# Patient Record
Sex: Female | Born: 1970 | Race: Black or African American | Hispanic: No | Marital: Married | Smoking: Never smoker
Health system: Southern US, Community
[De-identification: ages and names within clinical notes are randomized; demographics above are authoritative.]

## PROBLEM LIST (undated history)

## (undated) DIAGNOSIS — K589 Irritable bowel syndrome without diarrhea: Secondary | ICD-10-CM

## (undated) DIAGNOSIS — B9689 Other specified bacterial agents as the cause of diseases classified elsewhere: Secondary | ICD-10-CM

## (undated) DIAGNOSIS — K219 Gastro-esophageal reflux disease without esophagitis: Secondary | ICD-10-CM

## (undated) DIAGNOSIS — D219 Benign neoplasm of connective and other soft tissue, unspecified: Secondary | ICD-10-CM

## (undated) DIAGNOSIS — I341 Nonrheumatic mitral (valve) prolapse: Secondary | ICD-10-CM

## (undated) DIAGNOSIS — Z9889 Other specified postprocedural states: Secondary | ICD-10-CM

## (undated) DIAGNOSIS — N760 Acute vaginitis: Secondary | ICD-10-CM

## (undated) DIAGNOSIS — H6690 Otitis media, unspecified, unspecified ear: Secondary | ICD-10-CM

## (undated) DIAGNOSIS — D1800 Hemangioma unspecified site: Secondary | ICD-10-CM

## (undated) DIAGNOSIS — Z87440 Personal history of urinary (tract) infections: Secondary | ICD-10-CM

## (undated) DIAGNOSIS — Z8669 Personal history of other diseases of the nervous system and sense organs: Secondary | ICD-10-CM

## (undated) DIAGNOSIS — G510 Bell's palsy: Secondary | ICD-10-CM

## (undated) HISTORY — DX: Gastro-esophageal reflux disease without esophagitis: K21.9

## (undated) HISTORY — DX: Personal history of other diseases of the nervous system and sense organs: Z86.69

## (undated) HISTORY — DX: Other specified bacterial agents as the cause of diseases classified elsewhere: B96.89

## (undated) HISTORY — DX: Personal history of urinary (tract) infections: Z87.440

## (undated) HISTORY — DX: Otitis media, unspecified, unspecified ear: H66.90

## (undated) HISTORY — PX: BUNIONECTOMY: SHX129

## (undated) HISTORY — PX: COLONOSCOPY: SHX174

## (undated) HISTORY — DX: Other specified postprocedural states: Z98.890

## (undated) HISTORY — DX: Other specified bacterial agents as the cause of diseases classified elsewhere: N76.0

## (undated) HISTORY — PX: TOOTH EXTRACTION: SUR596

## (undated) HISTORY — DX: Hemangioma unspecified site: D18.00

## (undated) HISTORY — DX: Irritable bowel syndrome, unspecified: K58.9

## (undated) HISTORY — DX: Benign neoplasm of connective and other soft tissue, unspecified: D21.9

## (undated) HISTORY — DX: Nonrheumatic mitral (valve) prolapse: I34.1

## (undated) HISTORY — DX: Bell's palsy: G51.0

---
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
## 1999-03-07 ENCOUNTER — Inpatient Hospital Stay (HOSPITAL_COMMUNITY): Admission: AD | Admit: 1999-03-07 | Discharge: 1999-03-07 | Payer: Self-pay | Admitting: Obstetrics & Gynecology

## 2000-06-20 ENCOUNTER — Inpatient Hospital Stay (HOSPITAL_COMMUNITY): Admission: AD | Admit: 2000-06-20 | Discharge: 2000-06-20 | Payer: Self-pay | Admitting: *Deleted

## 2003-09-11 ENCOUNTER — Inpatient Hospital Stay (HOSPITAL_COMMUNITY): Admission: AD | Admit: 2003-09-11 | Discharge: 2003-09-11 | Payer: Self-pay | Admitting: Obstetrics and Gynecology

## 2004-04-04 ENCOUNTER — Ambulatory Visit (HOSPITAL_COMMUNITY): Admission: RE | Admit: 2004-04-04 | Discharge: 2004-04-04 | Payer: Self-pay | Admitting: Obstetrics and Gynecology

## 2004-10-03 ENCOUNTER — Ambulatory Visit: Payer: Self-pay | Admitting: Professional

## 2004-10-10 ENCOUNTER — Ambulatory Visit: Payer: Self-pay | Admitting: Professional

## 2004-10-27 DIAGNOSIS — D219 Benign neoplasm of connective and other soft tissue, unspecified: Secondary | ICD-10-CM

## 2004-10-27 HISTORY — DX: Benign neoplasm of connective and other soft tissue, unspecified: D21.9

## 2004-10-31 ENCOUNTER — Ambulatory Visit: Payer: Self-pay | Admitting: Professional

## 2004-11-07 ENCOUNTER — Ambulatory Visit: Payer: Self-pay | Admitting: Professional

## 2004-11-21 ENCOUNTER — Ambulatory Visit: Payer: Self-pay | Admitting: Professional

## 2004-12-25 ENCOUNTER — Other Ambulatory Visit: Admission: RE | Admit: 2004-12-25 | Discharge: 2004-12-25 | Payer: Self-pay | Admitting: Obstetrics and Gynecology

## 2006-02-25 ENCOUNTER — Other Ambulatory Visit: Admission: RE | Admit: 2006-02-25 | Discharge: 2006-02-25 | Payer: Self-pay | Admitting: Obstetrics and Gynecology

## 2006-04-08 ENCOUNTER — Ambulatory Visit (HOSPITAL_COMMUNITY): Admission: RE | Admit: 2006-04-08 | Discharge: 2006-04-08 | Payer: Self-pay | Admitting: Obstetrics and Gynecology

## 2006-12-07 ENCOUNTER — Ambulatory Visit (HOSPITAL_COMMUNITY): Admission: RE | Admit: 2006-12-07 | Discharge: 2006-12-07 | Payer: Self-pay | Admitting: Obstetrics and Gynecology

## 2007-04-23 ENCOUNTER — Other Ambulatory Visit: Admission: RE | Admit: 2007-04-23 | Discharge: 2007-04-23 | Payer: Self-pay | Admitting: Obstetrics and Gynecology

## 2008-03-17 ENCOUNTER — Ambulatory Visit (HOSPITAL_COMMUNITY): Admission: RE | Admit: 2008-03-17 | Discharge: 2008-03-17 | Payer: Self-pay | Admitting: Obstetrics and Gynecology

## 2008-04-23 ENCOUNTER — Inpatient Hospital Stay (HOSPITAL_COMMUNITY): Admission: AD | Admit: 2008-04-23 | Discharge: 2008-04-24 | Payer: Self-pay | Admitting: Obstetrics and Gynecology

## 2008-09-12 ENCOUNTER — Encounter: Admission: RE | Admit: 2008-09-12 | Discharge: 2008-09-12 | Payer: Self-pay | Admitting: Obstetrics and Gynecology

## 2008-10-27 ENCOUNTER — Inpatient Hospital Stay (HOSPITAL_COMMUNITY): Admission: AD | Admit: 2008-10-27 | Discharge: 2008-10-31 | Payer: Self-pay | Admitting: Obstetrics and Gynecology

## 2008-10-27 ENCOUNTER — Inpatient Hospital Stay (HOSPITAL_COMMUNITY): Admission: AD | Admit: 2008-10-27 | Discharge: 2008-10-27 | Payer: Self-pay | Admitting: Obstetrics and Gynecology

## 2008-10-27 ENCOUNTER — Encounter (INDEPENDENT_AMBULATORY_CARE_PROVIDER_SITE_OTHER): Payer: Self-pay | Admitting: Obstetrics and Gynecology

## 2008-11-01 ENCOUNTER — Encounter: Admission: RE | Admit: 2008-11-01 | Discharge: 2008-12-01 | Payer: Self-pay | Admitting: Obstetrics and Gynecology

## 2008-12-02 ENCOUNTER — Encounter: Admission: RE | Admit: 2008-12-02 | Discharge: 2008-12-29 | Payer: Self-pay | Admitting: Obstetrics and Gynecology

## 2008-12-30 ENCOUNTER — Encounter: Admission: RE | Admit: 2008-12-30 | Discharge: 2009-01-29 | Payer: Self-pay | Admitting: Obstetrics and Gynecology

## 2009-01-30 ENCOUNTER — Encounter: Admission: RE | Admit: 2009-01-30 | Discharge: 2009-02-17 | Payer: Self-pay | Admitting: Obstetrics and Gynecology

## 2010-03-04 ENCOUNTER — Ambulatory Visit (HOSPITAL_COMMUNITY): Admission: RE | Admit: 2010-03-04 | Discharge: 2010-03-04 | Payer: Self-pay | Admitting: Obstetrics and Gynecology

## 2010-10-27 DIAGNOSIS — D1800 Hemangioma unspecified site: Secondary | ICD-10-CM

## 2010-10-27 DIAGNOSIS — H6690 Otitis media, unspecified, unspecified ear: Secondary | ICD-10-CM

## 2010-10-27 HISTORY — DX: Otitis media, unspecified, unspecified ear: H66.90

## 2010-10-27 HISTORY — PX: ENDOMETRIAL BIOPSY: SHX622

## 2010-10-27 HISTORY — DX: Hemangioma unspecified site: D18.00

## 2010-11-05 ENCOUNTER — Encounter
Admission: RE | Admit: 2010-11-05 | Discharge: 2010-11-05 | Payer: Self-pay | Source: Home / Self Care | Attending: Obstetrics and Gynecology | Admitting: Obstetrics and Gynecology

## 2010-11-17 ENCOUNTER — Encounter: Payer: Self-pay | Admitting: Obstetrics and Gynecology

## 2011-02-10 LAB — CBC
HCT: 24 % — ABNORMAL LOW (ref 36.0–46.0)
HCT: 29.3 % — ABNORMAL LOW (ref 36.0–46.0)
Hemoglobin: 7.9 g/dL — CL (ref 12.0–15.0)
Hemoglobin: 9.5 g/dL — ABNORMAL LOW (ref 12.0–15.0)
MCHC: 32.4 g/dL (ref 30.0–36.0)
MCHC: 33.1 g/dL (ref 30.0–36.0)
MCV: 76.5 fL — ABNORMAL LOW (ref 78.0–100.0)
MCV: 77.3 fL — ABNORMAL LOW (ref 78.0–100.0)
Platelets: 434 10*3/uL — ABNORMAL HIGH (ref 150–400)
Platelets: 502 10*3/uL — ABNORMAL HIGH (ref 150–400)
RBC: 3.14 MIL/uL — ABNORMAL LOW (ref 3.87–5.11)
RBC: 3.79 MIL/uL — ABNORMAL LOW (ref 3.87–5.11)
RDW: 17.5 % — ABNORMAL HIGH (ref 11.5–15.5)
RDW: 17.7 % — ABNORMAL HIGH (ref 11.5–15.5)
WBC: 10.1 10*3/uL (ref 4.0–10.5)
WBC: 9.2 10*3/uL (ref 4.0–10.5)

## 2011-02-10 LAB — RPR: RPR Ser Ql: NONREACTIVE

## 2011-03-11 NOTE — Op Note (Signed)
Margaret Durham, QUAST NO.:  [id number]   MEDICAL RECORD NO.:  [id number]          PATIENT TYPE:  INP   LOCATION:  9142                          FACILITY:  [facility]   PHYSICIAN:  Osborn Coho, M.D.   DATE OF BIRTH:  Jul 30, 1971   DATE OF PROCEDURE:  10/27/2008  DATE OF DISCHARGE:                               OPERATIVE REPORT   PREOPERATIVE DIAGNOSES:  1. 39 plus weeks.  2. Nonreassuring fetal heart tracing.  3. Chorioamnionitis.  4. Thick meconium   POSTOPERATIVE DIAGNOSES:  1. 39 plus weeks.  2. Nonreassuring fetal heart tracing.  3. Chorioamnionitis.  4. Thick meconium.   PROCEDURE:  Primary low transverse cesarean section.   ANESTHESIA:  Epidural.   ATTENDING:  Osborn Coho, MD   ASSISTANT:  Elby Showers. Williams, CNM   FLUIDS:  3700 mL.   URINE OUTPUT:  375 mL.   ESTIMATED BLOOD LOSS:  800 mL.   COMPLICATIONS:  None.   FINDINGS:  Live female infant with Apgars of 9 at 1 minute and 9 at 5  minutes weighing 7 pounds and 10 ounces.  Normal-appearing bilateral  ovaries and fallopian tubes.  There were 2 pedunculated fibroids noted,  one was on the anterior wall and adherent to the anterior abdominal wall  approaching the bladder and the other was at the fundus.   PROCEDURE IN DETAIL:  The patient was taken to the operating room  emergently secondary to fetal bradycardia and while in the operating  room, the fetal heart beat returned to baseline.  The patient was being  watched closely secondary to fetal decels and chorioamnionitis and thick  meconium.  Once in the operating room, the risks, benefits, and  alternatives were discussed with the patient and the patient verbalized  understanding and consent signed and witnessed and the patient was given  a surgical level, via the epidural.  A Pfannenstiel skin incision was  made and carried down to the underlying layer of fascia with the  scalpel.  The fascia was excised bilaterally in the midline and  extended  bilaterally manually.  Kocher clamps were placed on the inferior aspect  of the fascial incision and the rectus muscle was excised from the  fascia.  The same was done on the superior aspect of the fascial  incision.  The rectus muscle was separated in the midline and the  peritoneum was entered bluntly and extended manually.  The bladder blade  was placed and bladder flap created with the Metzenbaum scissors.  The  uterine incision was made with a scalpel and extended bilaterally with  the bandage scissors.  The cord was noted to be right at the level of  the incision and upon delivery of the infant in the vertex presentation,  the cord was somewhat looped near the anterior shoulder.  The cord was  clamped and cut and the infant handed to the awaiting pediatricians.  The placenta was removed via fundal massage and the uterus was cleared  of all clots and debris.  The uterine incision was repaired with 0  Vicryl, via a running interlocking stitch and a second  imbricating layer  was performed.  The uterine incision was noted to be hemostatic and the  intra-abdominal cavity was copiously irrigated.  The 2 fibroids were  noted and the adhesions to the anterior wall fibroid were taken down  sharply and with the Bovie.  Hemostasis was obtained with the Bovie.  The bladder blade was removed and the peritoneum was repaired with 2-0  chromic, via a running stitch.  The fascia was repaired with 0 Vicryl,  via running stitch.  The subcutaneous tissue was reapproximated using 2-  0 plain, via 3 interrupted stitches.  The skin was reapproximated using  3-0 Monocryl.  Dermabond was placed at the edges of the incision and  dressing applied.  Sponge, lap, and needle count was correct.  The  patient tolerated the procedure well and was returned to the recovery  room in good condition.      Osborn Coho, M.D.  Electronically Signed     AR/MEDQ  D:  10/27/2008  T:  10/27/2008  Job:   [id number]

## 2011-03-11 NOTE — Discharge Summary (Signed)
NAMEMarland Kitchen  Margaret Durham, Margaret Durham NO.:  [id number]   MEDICAL RECORD NO.:  [id number]          PATIENT TYPE:  INP   LOCATION:  9142                          [facility]  [facility]   PHYSICIAN:  Osborn Coho, M.D.   DATE OF BIRTH:  Mar 24, 1971   DATE OF ADMISSION:  10/27/2008  DATE OF DISCHARGE:  10/31/2008                               DISCHARGE SUMMARY   ADMITTING PHYSICIAN:  Osborn Coho, MD   DISCHARGING PHYSICIAN:  Janine Limbo, MD   ADMISSION DIAGNOSES:  1. Intrauterine pregnancy at 97 and 2/7th weeks.  2. Early labor.  3. Meconium-stained amniotic fluid with premature rupture at term.   DISCHARGE DIAGNOSES:  1. Intrauterine pregnancy at 10 and 2/7th weeks.  2. Early labor.  3. Meconium-stained amniotic fluid with premature rupture at term.  4. Variable decelerations in labor.  5. Nonreassuring fetal heart rate tracing.  6. Chorioamnionitis.  7. Anemia, postop.   HOSPITAL PROCEDURES:  1. Electronic fetal monitoring.  2. Epidural anesthesia.  3. Internal fetal monitoring.  4. Primary low-transverse cesarean section.   HOSPITAL COURSE:  The patient was admitted in early labor with leakage  of meconium-stained fluid.  She was taken to Labor and Delivery.  IUPC  was placed for amnioinfusion and Dr. Su Hilt assumed care.  At that  time, she got an epidural for pain.  She progressed throughout the day  in the evening to 7-8 cm at which time she developed variable  decelerations and a nonreassuring fetal heart rate tracing and  chorioamnionitis.  Decision was made to proceed with primary low-  transverse cesarean section, which was performed under epidural  anesthesia for a live female infant, Apgars 9 and 9 weighing 7 pounds 10  ounces.  She was taken to recovery and then to Mother Baby Unit where  she received routine care.  On postop day #1, she was doing well,  hemoglobin was 7.9, but she was hemodynamically stable.  She had no  dizziness or trouble getting out  of bed.  She declined transfusion and  was started on iron supplements.  On postop day #2, she continued to  improve.  She was tolerating regular diet and was getting out of bed  without problems.  On postop day #3, the baby was having to stay for an  extra day for feeding issues, and the patient elected to stay an extra  day.  Her lochia was small.  Incision was clean and dry.  She met with  Social Work due to a history of depression and was felt to be stable for  discharge.  On postop day #4, she was ready to go home.  Vital signs  were stable.  Blood pressure 120/77.  Heart rate, regular rate and  rhythm.  Chest was clear.  Abdomen was soft and appropriately tender.  Incision was clean, dry, and intact.  Lochia was small.  Extremities  normal except for 1+ edema.  She was deemed to have received full  benefit of her hospital stay and was discharged home.   DISCHARGE MEDICATIONS:  Motrin 600 mg p.o. q.6 h. p.r.n., Tylox 1-2 p.o.  q.4 h. P.r.n.   DISCHARGE LABORATORIES:  White blood cell count 9.2, hemoglobin 7.9,  platelets 434.   DISCHARGE INSTRUCTIONS:  Per [facility] handout.   DISCHARGE FOLLOWUP:  In 6 weeks or p.r.n.   CONDITION ON DISCHARGE:  Good.      Margaret Durham, C.N.M.      Osborn Coho, M.D.  Electronically Signed    MLW/MEDQ  D:  10/31/2008  T:  10/31/2008  Job:  [id number]

## 2011-03-11 NOTE — H&P (Signed)
Margaret Durham, INGRAM NO.:  [id number]   MEDICAL RECORD NO.:  [id number]          PATIENT TYPE:  INP   LOCATION:  9168                          [facility]  [facility]   PHYSICIAN:  Osborn Coho, M.D.   DATE OF BIRTH:  01-19-1971   DATE OF ADMISSION:  10/27/2008  DATE OF DISCHARGE:                              HISTORY & PHYSICAL   HISTORY OF PRESENT ILLNESS:  This is a 40 year old gravida 2, para 0-0-1-  0 at 69 and 2/7 weeks' who presents unannounced with contractions while  sitting in triage.  She started leaking green and yellow fluid.  She  reports positive fetal movement.  Pregnancy has been followed by Dr.  Pennie Rushing, remarkable for.  1. Fibroids.  2. AMA.  3. IBS.  4. Mitral valve prolapse.  5. GERD.  6. Increased BMI.  7. First trimester spotting.  8. Group B strep negative.   ALLERGIES:  MACROBID causes a rash.   OB HISTORY:  Remarkable for an elective abortion in 1986.   MEDICAL HISTORY:  Remarkable for fibroids diagnosed in 2006, childhood  varicella, mitral valve prolapse, and irritable bowel syndrome.   SURGICAL HISTORY:  Remarkable for elective abortion in 1986.   FAMILY HISTORY:  Remarkable for grandmother and aunt with mitral valve  prolapse.  Grandmother with high blood pressure.  Mother with varicose  veins.  Grandmother with diabetes.  Two grandparents with stroke.   GENETIC HISTORY:  Remarkable for the patient's age of 18 and father of  baby's cousin with webbed toes.   SOCIAL HISTORY:  The patient is married to Sharrie Self, who is  involved and supportive.  She does not report religious affiliation.  She denies any alcohol, tobacco, or drug use.   PRENATAL LABS:  Hemoglobin 12.7, platelets 400, blood type O positive,  antibody screen negative, sickle cell negative, RPR nonreactive, rubella  immune, hepatitis negative, HIV negative, Pap test normal, gonorrhea  negative, and Chlamydia negative.   History of current pregnancy, the  patient entered care at 10 weeks'  gestation.  She was counseled on amnio and declined.  She was treated  for nausea, vomiting, and migraines in the first trimester.  She had a  quad screen and an 18-week ultrasound, which gave her a adjusted risk of  1:216.  She had a normal Glucola at 20 weeks' and an elevated one at  that 30 weeks.  Her 3-hour GTT had one out of four values elevated.  She  had an ultrasound of 35 weeks' showing 71 percentile growth and normal  AFI.  Her group B strep was negative at term.  She did express desire  for a tubal ligation.   OBJECTIVE:  VITAL SIGNS:  Stable and afebrile.  HEENT: Within normal limits.  NECK:  Thyroid normal, not enlarged.  CHEST:  Clear to auscultation.  HEART:  Regular rate and rhythm.  ABDOMEN:  Gravid, vertex Sarles exam shows reactive fetal heart rate  with contractions every 3-4 minutes.  There is thick yellow to green  discharge leaking from the vagina.  Cervix is difficult to reach  secondary to fetal position  and patient discomfort.  Cervix is about 1  cm, 75-80% effaced, soft and posterior and -3 with a vertex  presentation, vertex presentation was confirmed this morning by  ultrasound at the bedside.   ASSESSMENT:  1. Intrauterine pregnancy at 30 and 2/7 week.  2. Early labor.  3. Meconium-stained amniotic fluid.   PLAN:  1. Admit to birthing suites per Dr. Su Hilt.  2. Routine MD orders.  3. The patient wants epidural.      Elby Showers. Williams, C.N.M.      Osborn Coho, M.D.  Electronically Signed    MLW/MEDQ  D:  10/27/2008  T:  10/27/2008  Job:  [id number]

## 2011-04-30 ENCOUNTER — Emergency Department: Payer: Self-pay | Admitting: Internal Medicine

## 2011-07-24 LAB — URINALYSIS, ROUTINE W REFLEX MICROSCOPIC
Bilirubin Urine: NEGATIVE
Glucose, UA: NEGATIVE
Hgb urine dipstick: NEGATIVE
Ketones, ur: 15 — AB
Nitrite: NEGATIVE
Protein, ur: NEGATIVE
Specific Gravity, Urine: 1.015
Urobilinogen, UA: 0.2
pH: 6

## 2012-04-20 ENCOUNTER — Telehealth: Payer: Self-pay | Admitting: Obstetrics and Gynecology

## 2012-04-20 NOTE — Telephone Encounter (Signed)
Triage/cht received 

## 2012-04-20 NOTE — Telephone Encounter (Signed)
Pt states she has tenderness in her abdomen and pain after intercourse. Pt states she has an annual appointment on 05/14/12. Advised pt that she could be scheduled for an earlier appointment but she decided she would like to wait until her annual to discuss these issues. Advised pt to call back if any other problems or if she would like to make an appointment sooner. Pt voiced understanding.

## 2012-05-14 ENCOUNTER — Encounter: Payer: Self-pay | Admitting: Obstetrics and Gynecology

## 2012-05-14 ENCOUNTER — Ambulatory Visit (INDEPENDENT_AMBULATORY_CARE_PROVIDER_SITE_OTHER): Payer: BC Managed Care – PPO | Admitting: Obstetrics and Gynecology

## 2012-05-14 VITALS — BP 100/58 | HR 96 | Ht 65.0 in | Wt 186.0 lb

## 2012-05-14 DIAGNOSIS — D259 Leiomyoma of uterus, unspecified: Secondary | ICD-10-CM

## 2012-05-14 DIAGNOSIS — Z124 Encounter for screening for malignant neoplasm of cervix: Secondary | ICD-10-CM

## 2012-05-14 DIAGNOSIS — D219 Benign neoplasm of connective and other soft tissue, unspecified: Secondary | ICD-10-CM | POA: Insufficient documentation

## 2012-05-14 DIAGNOSIS — D1800 Hemangioma unspecified site: Secondary | ICD-10-CM

## 2012-05-14 NOTE — Progress Notes (Signed)
Last Pap: 04/29/11 WNL: Yes Regular Periods:yes Contraception: none  Monthly Breast exam:yes, sometimes Tetanus<54yrs:no Nl.Bladder Function:yes Daily BMs:yes Healthy Diet:yes Calcium:yes Mammogram:yes Date of Mammogram: pt states she had one more than one year ago Exercise:no Have often Exercise:  Seatbelt: yes Abuse at home: no Stressful work:no Sigmoid-colonoscopy: n/a Bone Density: No PCP: Dr. Dorothyann Peng Change in PMH: none Change in WUJ:WJXB BP 100/58  Pulse 96  Ht 5\' 5"  (1.651 m)  Wt 186 lb (84.369 kg)  BMI 30.95 kg/m2  LMP 04/21/2012 Pt without complaints Physical Examination: General appearance - alert, well appearing, and in no distress Mental status - normal mood, behavior, speech, dress, motor activity, and thought processes Neck - supple, no significant adenopathy, thyroid exam: thyroid is normal in size without nodules or tenderness Chest - clear to auscultation, no wheezes, rales or rhonchi, symmetric air entry Heart - normal rate and regular rhythm Abdomen - soft, nontender, nondistended, no masses or organomegaly Breasts - breasts appear normal, no suspicious masses, no skin or nipple changes or axillary nodes Pelvic - normal external genitalia, vulva, vagina, cervix, uterus irregular mobile NT and adnexa Rectal - , rectal exam not indicated Back exam - full range of motion, no tenderness, palpable spasm or pain on motion Neurological - alert, oriented, normal speech, no focal findings or movement disorder noted Musculoskeletal - no joint tenderness, deformity or swelling Extremities - no edema, redness or tenderness in the calves or thighs Skin - normal coloration and turgor, no rashes, no suspicious skin lesions noted Routine exam Liver hemangiomas will repeat CT to f/u on growth.  Pt has to have non hormonal BC.  Pt now using condoms  Pap sent yes next due in 3 years Mammogram due yes ordered  RT 1 yr

## 2012-05-14 NOTE — Patient Instructions (Signed)
Intrauterine Device Information An intrauterine device (IUD) is inserted into your uterus and prevents pregnancy. There are 2 types of IUDs available:  Copper IUD. This type of IUD is wrapped in copper wire and is placed inside the uterus. Copper makes the uterus and fallopian tubes produce a fluid that kills sperm. The copper IUD can stay in place for 10 years.   Hormone IUD. This type of IUD contains the hormone progestin (synthetic progesterone). The hormone thickens the cervical mucus and prevents sperm from entering the uterus, and it also thins the uterine lining to prevent implantation of a fertilized egg. The hormone can weaken or kill the sperm that get into the uterus. The hormone IUD can stay in place for 5 years.  Your caregiver will make sure you are a good candidate for a contraceptive IUD. Discuss with your caregiver the possible side effects. ADVANTAGES  It is highly effective, reversible, long-acting, and low maintenance.   There are no estrogen-related side effects.   An IUD can be used when breastfeeding.   It is not associated with weight gain.   It works immediately after insertion.   The copper IUD does not interfere with your female hormones.   The progesterone IUD can make heavy menstrual periods lighter.   The progesterone IUD can be used for 5 years.   The copper IUD can be used for 10 years.  DISADVANTAGES  The progesterone IUD can be associated with irregular bleeding patterns.   The copper IUD can make your menstrual flow heavier and more painful.   You may experience cramping and vaginal bleeding after insertion.  Document Released: 09/16/2004 Document Revised: 10/02/2011 Document Reviewed: 02/15/2011 [facility] Patient Information 2012 [place], [place]. Diaphragm A diaphragmis a soft, latex, dome-shaped barrier that is placed in the vagina with spermicidal jelly before sexual intercourse. It covers the cervix, kills sperm, and blocks the passage of  sperm into the cervix. This method does not protect against sexually transmitted diseases (STDs). A diaphragm must be fitted by a caregiver during a pelvic exam. A caregiver will measure your vagina prior to prescribing a diaphragm. You will also learn about use, care, and problems of a diaphragm during the exam. If you have significant weight changes or become pregnant, it is important to have the diaphragm rechecked and possibly refitted. The diaphragm should be replaced every 2 years, or sooner if damaged. ADVANTAGES  You can use it while breastfeeding.   It is not felt by your sex partner.   It does not interfere with your female hormones.   It works immediately and is not permanent.  DISADVANTAGES  It is sometimes difficult to insert.   It may shift out of place during sexual intercourse.   You must have it fitted and refitted by your caregiver.  HOW TO INSERT A DIAPHRAGM 1. Check the diaphragm for holes by holding it up to the light, stretching the latex, or by filling it with water.  2. Place the spermicide cream or jelly inside the dome and around the rim of the diaphragm.  3. Squeeze the rim of the diaphragm and insert the diaphragm into the vagina.  4. The opening of the dome should face the cervix while inserting the diaphragm.  5. The front part (or top) of the rim should be behind the pubic bone and pushed over the top of the cervix.  6. Be sure the cervix is completely covered by reaching into your vagina and feeling the cervix behind the latex dome  of the diaphragm. If you are uncomfortable, it is not inserted properly. Try inserting it again.  7. Leave the diaphragm in for 6 to 8 hours after intercourse. If intercourse occurs again within these 6 hours, another application of spermicide is needed.  8. The diaphragm should not be left in place for longer than 24 hours.  HOME CARE INSTRUCTIONS   Wash the diaphragm with mild soap and warm water. Rinse thoroughly and dry  completely after every use.   Only use water-based lubricants with the diaphragm. Oil-based lubricants can damage the diaphragm.   Do not use talc on the diaphragm.   Do not use the diaphragm if:   You had a baby in the last 2 months.   You have a vaginal infection.   You are having a menstrual period.   You had recent surgery on your cervix or vagina.   You have vaginal bleeding of unknown cause.   Your sex partner is allergic to latex or spermicides.  SEEK MEDICAL CARE IF:   You have pain during sexual intercourse when using the diaphragm.   The diaphragm slips out of place during sexual intercourse.   You have a fever.   You have blood in your urine.   You have burning or pain when you urinate.   You find a hole in the diaphragm.   You develop abnormal vaginal discharge.   You have vaginal itching or irritation.   You cannot remove the diaphragm.   You think you may be pregnant.   You need to be refitted for a diaphragm.  Document Released: 01/03/2003 Document Revised: 10/02/2011 Document Reviewed: 02/26/2011 [facility] Patient Information 2012 [place], [place].

## 2012-05-18 LAB — PAP IG W/ RFLX HPV ASCU

## 2012-05-19 ENCOUNTER — Telehealth: Payer: Self-pay

## 2012-05-19 ENCOUNTER — Other Ambulatory Visit: Payer: Self-pay

## 2012-05-19 ENCOUNTER — Telehealth: Payer: Self-pay | Admitting: Obstetrics and Gynecology

## 2012-05-19 DIAGNOSIS — D1800 Hemangioma unspecified site: Secondary | ICD-10-CM

## 2012-05-19 NOTE — Telephone Encounter (Signed)
Spoke with pt rgd refer informed correct order put in for ct scan advised pt to call [facility] to sch appt pt voice understanding

## 2012-05-20 ENCOUNTER — Other Ambulatory Visit (HOSPITAL_COMMUNITY): Payer: Self-pay

## 2012-05-25 ENCOUNTER — Ambulatory Visit (HOSPITAL_COMMUNITY)
Admission: RE | Admit: 2012-05-25 | Discharge: 2012-05-25 | Disposition: A | Discharge status: Home / Self Care | Payer: BC Managed Care – PPO | Source: Ambulatory Visit | Attending: Obstetrics and Gynecology | Admitting: Obstetrics and Gynecology

## 2012-05-25 DIAGNOSIS — K7689 Other specified diseases of liver: Secondary | ICD-10-CM | POA: Insufficient documentation

## 2012-05-25 DIAGNOSIS — D259 Leiomyoma of uterus, unspecified: Secondary | ICD-10-CM | POA: Insufficient documentation

## 2012-05-25 DIAGNOSIS — D1800 Hemangioma unspecified site: Secondary | ICD-10-CM

## 2012-05-25 MED ORDER — IOHEXOL 300 MG/ML  SOLN
100.0000 mL | Freq: Once | INTRAMUSCULAR | Status: AC | PRN
  Administered 2012-05-25: 100 mL via INTRAVENOUS

## 2012-05-27 ENCOUNTER — Telehealth: Payer: Self-pay

## 2012-05-27 NOTE — Telephone Encounter (Signed)
Spoke with pt rgd ct scan informed no change since last ct will refer to Dr.Mann pt has appt 06/22/12 at 1:30 pt voice understanding

## 2012-05-27 NOTE — Telephone Encounter (Signed)
Message copied by Rolla Plate on Thu May 27, 2012 10:58 AM ------      Message from: Jaymes Graff      Created: Thu May 27, 2012  9:55 AM       Please tell the patient that her CT scan is unchanged from the previous one.  Her hemangiomas and fibroids have not changed in size.  Please refer her to Dr Loreta Ave for further evaluation and recommendations for this condition.  Thank you

## 2012-06-04 ENCOUNTER — Ambulatory Visit: Payer: Self-pay

## 2012-06-08 ENCOUNTER — Ambulatory Visit: Payer: Self-pay

## 2012-06-14 ENCOUNTER — Ambulatory Visit: Payer: Self-pay

## 2012-06-22 ENCOUNTER — Other Ambulatory Visit: Payer: Self-pay | Admitting: Gastroenterology

## 2012-06-22 DIAGNOSIS — R1011 Right upper quadrant pain: Secondary | ICD-10-CM

## 2012-06-22 DIAGNOSIS — R110 Nausea: Secondary | ICD-10-CM

## 2012-06-22 DIAGNOSIS — R1013 Epigastric pain: Secondary | ICD-10-CM

## 2012-09-02 ENCOUNTER — Ambulatory Visit (HOSPITAL_COMMUNITY): Payer: BC Managed Care – PPO

## 2012-09-02 ENCOUNTER — Encounter (HOSPITAL_COMMUNITY): Payer: BC Managed Care – PPO

## 2013-10-24 ENCOUNTER — Other Ambulatory Visit: Payer: Self-pay | Admitting: Obstetrics and Gynecology

## 2013-10-24 DIAGNOSIS — Z1231 Encounter for screening mammogram for malignant neoplasm of breast: Secondary | ICD-10-CM

## 2013-11-18 ENCOUNTER — Ambulatory Visit (HOSPITAL_COMMUNITY)
Admission: RE | Admit: 2013-11-18 | Discharge: 2013-11-18 | Disposition: A | Discharge status: Home / Self Care | Payer: BC Managed Care – PPO | Source: Ambulatory Visit | Attending: Obstetrics and Gynecology | Admitting: Obstetrics and Gynecology

## 2013-11-18 ENCOUNTER — Ambulatory Visit (HOSPITAL_COMMUNITY): Payer: BC Managed Care – PPO

## 2013-11-18 DIAGNOSIS — Z1231 Encounter for screening mammogram for malignant neoplasm of breast: Secondary | ICD-10-CM

## 2013-11-22 ENCOUNTER — Other Ambulatory Visit: Payer: Self-pay | Admitting: Obstetrics and Gynecology

## 2013-11-22 DIAGNOSIS — R928 Other abnormal and inconclusive findings on diagnostic imaging of breast: Secondary | ICD-10-CM

## 2013-11-24 ENCOUNTER — Other Ambulatory Visit: Payer: Self-pay

## 2013-11-24 ENCOUNTER — Other Ambulatory Visit: Payer: Self-pay | Admitting: Obstetrics and Gynecology

## 2013-11-24 DIAGNOSIS — R928 Other abnormal and inconclusive findings on diagnostic imaging of breast: Secondary | ICD-10-CM

## 2013-12-01 ENCOUNTER — Ambulatory Visit
Admission: RE | Admit: 2013-12-01 | Discharge: 2013-12-01 | Disposition: A | Discharge status: Home / Self Care | Payer: BC Managed Care – PPO | Source: Ambulatory Visit | Attending: Obstetrics and Gynecology | Admitting: Obstetrics and Gynecology

## 2013-12-01 DIAGNOSIS — R928 Other abnormal and inconclusive findings on diagnostic imaging of breast: Secondary | ICD-10-CM

## 2014-02-07 LAB — HM DIABETES EYE EXAM

## 2014-08-28 ENCOUNTER — Encounter: Payer: Self-pay | Admitting: Obstetrics and Gynecology

## 2014-11-28 ENCOUNTER — Ambulatory Visit: Payer: BC Managed Care – PPO | Admitting: Family Medicine

## 2014-11-30 ENCOUNTER — Other Ambulatory Visit (HOSPITAL_COMMUNITY): Payer: Self-pay | Admitting: Obstetrics and Gynecology

## 2014-11-30 DIAGNOSIS — Z1231 Encounter for screening mammogram for malignant neoplasm of breast: Secondary | ICD-10-CM

## 2014-12-07 ENCOUNTER — Ambulatory Visit (HOSPITAL_COMMUNITY)
Admission: RE | Admit: 2014-12-07 | Discharge: 2014-12-07 | Disposition: A | Discharge status: Home / Self Care | Payer: BC Managed Care – PPO | Source: Ambulatory Visit | Attending: Obstetrics and Gynecology | Admitting: Obstetrics and Gynecology

## 2014-12-07 DIAGNOSIS — Z1231 Encounter for screening mammogram for malignant neoplasm of breast: Secondary | ICD-10-CM | POA: Insufficient documentation

## 2015-04-18 ENCOUNTER — Other Ambulatory Visit: Payer: Self-pay | Admitting: Family Medicine

## 2015-04-18 ENCOUNTER — Ambulatory Visit (INDEPENDENT_AMBULATORY_CARE_PROVIDER_SITE_OTHER): Payer: BC Managed Care – PPO | Admitting: Family Medicine

## 2015-04-18 ENCOUNTER — Encounter: Payer: Self-pay | Admitting: Family Medicine

## 2015-04-18 ENCOUNTER — Encounter (INDEPENDENT_AMBULATORY_CARE_PROVIDER_SITE_OTHER): Payer: Self-pay

## 2015-04-18 VITALS — BP 108/62 | HR 90 | Temp 98.2°F | Ht 65.0 in | Wt 174.5 lb

## 2015-04-18 DIAGNOSIS — Z0000 Encounter for general adult medical examination without abnormal findings: Secondary | ICD-10-CM | POA: Diagnosis not present

## 2015-04-18 DIAGNOSIS — Z01419 Encounter for gynecological examination (general) (routine) without abnormal findings: Secondary | ICD-10-CM

## 2015-04-18 DIAGNOSIS — R7989 Other specified abnormal findings of blood chemistry: Secondary | ICD-10-CM

## 2015-04-18 LAB — LIPID PANEL
Cholesterol: 173 mg/dL (ref 0–200)
HDL: 59.2 mg/dL (ref >39.00)
LDL CALC: 104 mg/dL — AB (ref 0–99)
NonHDL: 113.8
Total CHOL/HDL Ratio: 3
Triglycerides: 51 mg/dL (ref 0.0–149.0)
VLDL: 10.2 mg/dL (ref 0.0–40.0)

## 2015-04-18 LAB — CBC WITH DIFFERENTIAL/PLATELET
BASOS PCT: 0.7 % (ref 0.0–3.0)
Basophils Absolute: 0 10*3/uL (ref 0.0–0.1)
EOS PCT: 0.9 % (ref 0.0–5.0)
Eosinophils Absolute: 0 10*3/uL (ref 0.0–0.7)
HCT: 36.9 % (ref 36.0–46.0)
HEMOGLOBIN: 12.2 g/dL (ref 12.0–15.0)
LYMPHS PCT: 34 % (ref 12.0–46.0)
Lymphs Abs: 1.5 10*3/uL (ref 0.7–4.0)
MCHC: 33 g/dL (ref 30.0–36.0)
MCV: 90.6 fl (ref 78.0–100.0)
MONOS PCT: 8.7 % (ref 3.0–12.0)
Monocytes Absolute: 0.4 10*3/uL (ref 0.1–1.0)
NEUTROS ABS: 2.5 10*3/uL (ref 1.4–7.7)
NEUTROS PCT: 55.7 % (ref 43.0–77.0)
Platelets: 353 10*3/uL (ref 150.0–400.0)
RBC: 4.07 Mil/uL (ref 3.87–5.11)
RDW: 13.7 % (ref 11.5–15.5)
WBC: 4.5 10*3/uL (ref 4.0–10.5)

## 2015-04-18 LAB — COMPREHENSIVE METABOLIC PANEL
ALT: 8 U/L (ref 0–35)
AST: 14 U/L (ref 0–37)
Albumin: 3.9 g/dL (ref 3.5–5.2)
Alkaline Phosphatase: 63 U/L (ref 39–117)
BUN: 13 mg/dL (ref 6–23)
CALCIUM: 9.1 mg/dL (ref 8.4–10.5)
CHLORIDE: 106 meq/L (ref 96–112)
CO2: 29 mEq/L (ref 19–32)
CREATININE: 0.91 mg/dL (ref 0.40–1.20)
GFR: 85.64 mL/min (ref >60.00)
Glucose, Bld: 90 mg/dL (ref 70–99)
POTASSIUM: 4.3 meq/L (ref 3.5–5.1)
SODIUM: 139 meq/L (ref 135–145)
Total Bilirubin: 0.4 mg/dL (ref 0.2–1.2)
Total Protein: 6.7 g/dL (ref 6.0–8.3)

## 2015-04-18 LAB — TSH: TSH: 0.31 u[IU]/mL — AB (ref 0.35–4.50)

## 2015-04-18 NOTE — Progress Notes (Signed)
Pre visit review using our clinic review tool, if applicable. No additional management support is needed unless otherwise documented below in the visit note. 

## 2015-04-18 NOTE — Assessment & Plan Note (Signed)
Reviewed preventive care protocols, scheduled due services, and updated immunizations Discussed nutrition, exercise, diet, and healthy lifestyle.  Orders Placed This Encounter  Procedures  . CBC with Differential/Platelet  . Comprehensive metabolic panel  . Lipid panel  . TSH     

## 2015-04-18 NOTE — Progress Notes (Signed)
Subjective:   Patient ID: Margaret Durham, female    DOB: [birth year], 44 y.o.   MRN: [id number]  Margaret Durham is a pleasant 44 y.o. year old female who presents to clinic today with Establish Care  on 04/18/2015  HPI:  Healthy, does not take any medications regularly.  G1P1- has OBGYN- Dr. Charlesetta Garibaldi.  Son is 72 yo.  She is a professor at [employer], physically active.  Runs and does Zumba.  Has no complaints today.  Mammogram and pap smear UTD.  No current outpatient prescriptions on file prior to visit.   No current facility-administered medications on file prior to visit.    Allergies  Allergen Reactions  . Macrobid [Nitrofurantoin Macrocrystal]     Past Medical History  Diagnosis Date  . IBS (irritable bowel syndrome)   . MVP (mitral valve prolapse)   . Fibroids 2006  . History of D&C 1986  . Hemangioma 2012    found on CT scan (liver)  . Otitis 2012  . Bacterial vaginosis     recurrent   . Hx of bladder infections   . Hx of migraine headaches   . Bell's palsy     Past Surgical History  Procedure Laterality Date  . Endometrial biopsy  2012  . Cesarean section  2010  . Tooth extraction      Family History  Problem Relation Age of Onset  . Heart disease Maternal Aunt   . Cancer Maternal Aunt     breast  . Heart disease Maternal Grandmother   . Hypertension Maternal Grandmother   . Diabetes Maternal Grandmother   . Stroke Maternal Grandmother   . Stroke Maternal Grandfather   . Cancer Maternal Grandfather     lung     History   Social History  . Marital Status: Married    Spouse Name: N/A  . Number of Children: N/A  . Years of Education: N/A   Occupational History  . Not on file.   Social History Main Topics  . Smoking status: Never Smoker   . Smokeless tobacco: Never Used  . Alcohol Use: Yes  . Drug Use: No  . Sexual Activity: Yes    Birth Control/ Protection: None   Other Topics Concern  . Not on file   Social History  Narrative   The PMH, PSH, Social History, Family History, Medications, and allergies have been reviewed in [facility], and have been updated if relevant.       Review of Systems  Constitutional: Negative.   HENT: Negative.   Eyes: Negative.   Respiratory: Negative.   Cardiovascular: Negative.   Gastrointestinal: Negative.   Endocrine: Negative.   Genitourinary: Negative.   Musculoskeletal: Negative.   Skin: Negative.   Allergic/Immunologic: Negative.   Neurological: Negative.   Hematological: Negative.   Psychiatric/Behavioral: Negative.   All other systems reviewed and are negative.      Objective:    BP 108/62 mmHg  Pulse 90  Temp(Src) 98.2 F (36.8 C) (Oral)  Ht 5\' 5"  (1.651 m)  Wt 174 lb 8 oz (79.153 kg)  BMI 29.04 kg/m2  SpO2 98%  LMP 03/29/2015   Physical Exam  Constitutional: She is oriented to person, place, and time. She appears well-developed and well-nourished. No distress.  HENT:  Head: Normocephalic.  Eyes: Conjunctivae are normal.  Neck: Normal range of motion. Neck supple. No thyromegaly present.  Cardiovascular: Normal rate.   Murmur heard. Pulmonary/Chest: Effort normal and breath sounds normal.  Abdominal:  Soft. Bowel sounds are normal.  Musculoskeletal: Normal range of motion. She exhibits no edema.  Lymphadenopathy:    She has no cervical adenopathy.  Neurological: She is alert and oriented to person, place, and time. No cranial nerve deficit.  Skin: Skin is warm and dry.  Psychiatric: She has a normal mood and affect. Her behavior is normal. Judgment and thought content normal.          Assessment & Plan:   Well woman exam - Plan: CBC with Differential/Platelet, Comprehensive metabolic panel, Lipid panel, TSH No Follow-up on file.

## 2015-04-18 NOTE — Patient Instructions (Signed)
It was nice to meet you. We will call you with your lab results and you can view them online.

## 2015-05-14 ENCOUNTER — Ambulatory Visit: Payer: BC Managed Care – PPO | Admitting: Family Medicine

## 2015-08-17 LAB — HM DIABETES EYE EXAM

## 2015-08-20 ENCOUNTER — Encounter: Payer: Self-pay | Admitting: Family Medicine

## 2015-09-10 ENCOUNTER — Encounter: Payer: Self-pay | Admitting: Family Medicine

## 2015-09-10 ENCOUNTER — Ambulatory Visit (INDEPENDENT_AMBULATORY_CARE_PROVIDER_SITE_OTHER): Payer: BC Managed Care – PPO | Admitting: Family Medicine

## 2015-09-10 VITALS — BP 98/64 | HR 104 | Temp 98.1°F | Wt 181.2 lb

## 2015-09-10 DIAGNOSIS — Z23 Encounter for immunization: Secondary | ICD-10-CM

## 2015-09-10 DIAGNOSIS — R350 Frequency of micturition: Secondary | ICD-10-CM | POA: Diagnosis not present

## 2015-09-10 LAB — POCT URINALYSIS DIPSTICK
Bilirubin, UA: NEGATIVE
Blood, UA: NEGATIVE
GLUCOSE UA: NEGATIVE
Ketones, UA: NEGATIVE
LEUKOCYTES UA: NEGATIVE
Nitrite, UA: NEGATIVE
Protein, UA: NEGATIVE
Spec Grav, UA: 1.015
UROBILINOGEN UA: 0.2
pH, UA: 6

## 2015-09-10 NOTE — Progress Notes (Signed)
Pre visit review using our clinic review tool, if applicable. No additional management support is needed unless otherwise documented below in the visit note. 

## 2015-09-10 NOTE — Assessment & Plan Note (Signed)
New- persistent for months. Neg UA. OAB vs IC. Given handout on dietary changes she can make to improve symptoms of IC. Refer to urology for cystoscopy given duration of symptoms. The patient indicates understanding of these issues and agrees with the plan.

## 2015-09-10 NOTE — Progress Notes (Signed)
SUBJECTIVE: Margaret Durham is a 44 y.o. female who complains of urinary frequency and frequency with a sense of incomplete bladder emptying for months without flank pain, fever, chills, or abnormal vaginal discharge or bleeding.   Review of Systems  Constitutional: Positive for fatigue. Negative for fever.  Gastrointestinal: Negative.   Genitourinary: Positive for urgency and frequency. Negative for dysuria, hematuria, flank pain, decreased urine volume, vaginal bleeding, vaginal discharge, difficulty urinating, genital sores, vaginal pain, menstrual problem, pelvic pain and dyspareunia.  Neurological: Negative.   Hematological: Negative.   All other systems reviewed and are negative.     No current outpatient prescriptions on file prior to visit.   No current facility-administered medications on file prior to visit.    Allergies  Allergen Reactions  . Macrobid [Nitrofurantoin Macrocrystal]     Past Medical History  Diagnosis Date  . IBS (irritable bowel syndrome)   . MVP (mitral valve prolapse)   . Fibroids 2006  . History of D&C 1986  . Hemangioma 2012    found on CT scan (liver)  . Otitis 2012  . Bacterial vaginosis     recurrent   . Hx of bladder infections   . Hx of migraine headaches   . Bell's palsy   . MVP (mitral valve prolapse)     Past Surgical History  Procedure Laterality Date  . Endometrial biopsy  2012  . Cesarean section  2010  . Tooth extraction      Family History  Problem Relation Age of Onset  . Heart disease Maternal Aunt   . Cancer Maternal Aunt     breast  . Heart disease Maternal Grandmother   . Hypertension Maternal Grandmother   . Diabetes Maternal Grandmother   . Stroke Maternal Grandmother   . Stroke Maternal Grandfather   . Cancer Maternal Grandfather     lung     Social History   Social History  . Marital Status: Married    Spouse Name: N/A  . Number of Children: N/A  . Years of Education: N/A   Occupational  History  . Not on file.   Social History Main Topics  . Smoking status: Never Smoker   . Smokeless tobacco: Never Used  . Alcohol Use: Yes  . Drug Use: No  . Sexual Activity: Yes    Birth Control/ Protection: None   Other Topics Concern  . Not on file   Social History Narrative   The PMH, PSH, Social History, Family History, Medications, and allergies have been reviewed in [facility], and have been updated if relevant.  OBJECTIVE: BP 98/64 mmHg  Pulse 104  Temp(Src) 98.1 F (36.7 C) (Oral)  Wt 181 lb 4 oz (82.214 kg)  SpO2 97%  LMP 08/27/2015 (Approximate)   Appears well, in no apparent distress.  Vital signs are normal. The abdomen is soft without tenderness, guarding, mass, rebound or organomegaly. No CVA tenderness or inguinal adenopathy noted. Urine dipstick shows negative for all components.

## 2015-09-12 ENCOUNTER — Encounter: Payer: Self-pay | Admitting: Obstetrics and Gynecology

## 2015-09-12 ENCOUNTER — Ambulatory Visit (INDEPENDENT_AMBULATORY_CARE_PROVIDER_SITE_OTHER): Payer: BC Managed Care – PPO | Admitting: Obstetrics and Gynecology

## 2015-09-12 VITALS — BP 122/79 | HR 87 | Resp 16 | Ht 65.0 in | Wt 180.3 lb

## 2015-09-12 DIAGNOSIS — R350 Frequency of micturition: Secondary | ICD-10-CM | POA: Diagnosis not present

## 2015-09-12 LAB — URINALYSIS, COMPLETE
Bilirubin, UA: NEGATIVE
GLUCOSE, UA: NEGATIVE
KETONES UA: NEGATIVE
Leukocytes, UA: NEGATIVE
Nitrite, UA: NEGATIVE
PROTEIN UA: NEGATIVE
Specific Gravity, UA: 1.015 (ref 1.005–1.030)
Urobilinogen, Ur: 0.2 mg/dL (ref 0.2–1.0)
pH, UA: 7 (ref 5.0–7.5)

## 2015-09-12 LAB — MICROSCOPIC EXAMINATION: BACTERIA UA: NONE SEEN

## 2015-09-12 LAB — BLADDER SCAN AMB NON-IMAGING: Scan Result: 43

## 2015-09-12 NOTE — Progress Notes (Signed)
09/12/2015 4:32 PM   Seth Bake Charlynn Grimes 1970-12-10 [id number]  Referring provider: Lucille Passy, MD [place], [place] [address]  Chief Complaint  Patient presents with  . Urinary Frequency  . Establish Care    HPI: Patient is a 44 year old African-American female presenting today as a referral from her primary care provider with complaints of urinary frequency occurring intermittently over the last few months.  Nocturia at least 2 times per night.  She notices symptoms typically become worse at the beginning of her menstrual cycle and she occasionally feels the need to void every 22mins.  No associated urgency, incontinence or gross hematuria or dysuria. No pain with bladder filling. She was provided information on interstitial cystitis diet by her primary care provider but states that she has not looked at the information yet.  Denies history heavy or painful periods. States that she only recently starting experiences menstrual cramps.   Never been a smoker. Previous UA unremarkable. No vaginal complaints.  No Long history of GU malignancies.   PMH: Past Medical History  Diagnosis Date  . IBS (irritable bowel syndrome)   . MVP (mitral valve prolapse)   . Fibroids 2006  . History of D&C 1986  . Hemangioma 2012    found on CT scan (liver)  . Otitis 2012  . Bacterial vaginosis     recurrent   . Hx of bladder infections   . Hx of migraine headaches   . Bell's palsy   . MVP (mitral valve prolapse)     Surgical History: Past Surgical History  Procedure Laterality Date  . Endometrial biopsy  2012  . Cesarean section  2010  . Tooth extraction      Home Medications:    Medication List    Notice  As of 09/12/2015  4:32 PM   You have not been prescribed any medications.      Allergies:  Allergies  Allergen Reactions  . Macrobid [Nitrofurantoin Macrocrystal]     Family History: Family History  Problem Relation Age of Onset  . Heart disease  Maternal Aunt   . Cancer Maternal Aunt     breast  . Heart disease Maternal Grandmother   . Hypertension Maternal Grandmother   . Diabetes Maternal Grandmother   . Stroke Maternal Grandmother   . Stroke Maternal Grandfather   . Cancer Maternal Grandfather     lung     Social History:  reports that she has never smoked. She has never used smokeless tobacco. She reports that she drinks alcohol. She reports that she does not use illicit drugs.  ROS: UROLOGY Frequent Urination?: Yes Hard to postpone urination?: No Burning/pain with urination?: No Get up at night to urinate?: Yes Leakage of urine?: No Urine stream starts and stops?: Yes Trouble starting stream?: No Do you have to strain to urinate?: No Blood in urine?: No Urinary tract infection?: No Sexually transmitted disease?: No Injury to kidneys or bladder?: No Painful intercourse?: No Weak stream?: No Currently pregnant?: No Vaginal bleeding?: No Last menstrual period?: 08/27/2015  Gastrointestinal Nausea?: No Vomiting?: No Indigestion/heartburn?: No Diarrhea?: No Constipation?: No  Constitutional Fever: No Night sweats?: No Weight loss?: No Fatigue?: No  Skin Skin rash/lesions?: No Itching?: No  Eyes Blurred vision?: No Double vision?: No  Ears/Nose/Throat Sore throat?: No Sinus problems?: No  Hematologic/Lymphatic Swollen glands?: No Easy bruising?: No  Cardiovascular Leg swelling?: No Chest pain?: No  Respiratory Cough?: No Shortness of breath?: No  Endocrine Excessive thirst?: No  Musculoskeletal Back pain?: No Joint pain?: No  Neurological Headaches?: No Dizziness?: No  Psychologic Depression?: No Anxiety?: No  Physical Exam: BP 122/79 mmHg  Pulse 87  Resp 16  Ht 5\' 5"  (1.651 m)  Wt 180 lb 4.8 oz (81.784 kg)  BMI 30.00 kg/m2  LMP 08/27/2015 (Approximate)  Constitutional:  Alert and oriented, No acute distress. HEENT: [place] AT, moist mucus membranes.  Trachea midline,  no masses. Cardiovascular: No clubbing, cyanosis, or edema. Respiratory: Normal respiratory effort, no increased work of breathing. GI: Abdomen is soft, nontender, nondistended, no abdominal masses GU: No CVA tenderness.  Skin: No rashes, bruises or suspicious lesions. Lymph: No cervical or inguinal adenopathy. Neurologic: Grossly intact, no focal deficits, moving all 4 extremities. Psychiatric: Normal mood and affect.  Laboratory Data:   Urinalysis Results for orders placed or performed in visit on 09/12/15  Microscopic Examination  Result Value Ref Range   WBC, UA 0-5 0 -  5 /hpf   RBC, UA 0-2 0 -  2 /hpf   Epithelial Cells (non renal) 0-10 0 - 10 /hpf   Mucus, UA Present (A) Not Estab.   Bacteria, UA None seen None seen/Few  Urinalysis, Complete  Result Value Ref Range   Specific Gravity, UA 1.015 1.005 - 1.030   pH, UA 7.0 5.0 - 7.5   Color, UA Yellow Yellow   Appearance Ur Clear Clear   Leukocytes, UA Negative Negative   Protein, UA Negative Negative/Trace   Glucose, UA Negative Negative   Ketones, UA Negative Negative   RBC, UA Trace (A) Negative   Bilirubin, UA Negative Negative   Urobilinogen, Ur 0.2 0.2 - 1.0 mg/dL   Nitrite, UA Negative Negative   Microscopic Examination See below:   BLADDER SCAN AMB NON-IMAGING  Result Value Ref Range   Scan Result 43 mL    Pertinent Imaging:   Assessment & Plan:    1. Urinary frequency- PVR 77mL. Urinary frequency and current intermittently over the last 2-3 months. Encouraged patient to complete a bladder diary.  She would like postpone any further workup at this time including cystoscopy as recommended by her PCP. Encouraged increased water intake to dilute urine. She was provided additional information on IC diet reccommendations. - Urinalysis, Complete - BLADDER SCAN AMB NON-IMAGING  Return in about 2 months (around 11/12/2015).  These notes generated with voice recognition software. I apologize for typographical  errors.  Herbert Moors, [facility] [address], [place], [place] [address] [phone]

## 2015-11-13 ENCOUNTER — Ambulatory Visit (INDEPENDENT_AMBULATORY_CARE_PROVIDER_SITE_OTHER): Payer: BC Managed Care – PPO | Admitting: Obstetrics and Gynecology

## 2015-11-13 ENCOUNTER — Encounter: Payer: Self-pay | Admitting: Obstetrics and Gynecology

## 2015-11-13 VITALS — BP 104/71 | HR 64 | Resp 16 | Ht 65.0 in | Wt 182.8 lb

## 2015-11-13 DIAGNOSIS — D259 Leiomyoma of uterus, unspecified: Secondary | ICD-10-CM | POA: Diagnosis not present

## 2015-11-13 DIAGNOSIS — R350 Frequency of micturition: Secondary | ICD-10-CM | POA: Diagnosis not present

## 2015-11-13 LAB — MICROSCOPIC EXAMINATION: WBC, UA: NONE SEEN /hpf (ref 0–?)

## 2015-11-13 LAB — URINALYSIS, COMPLETE
Bilirubin, UA: NEGATIVE
Glucose, UA: NEGATIVE
Ketones, UA: NEGATIVE
LEUKOCYTES UA: NEGATIVE
Nitrite, UA: NEGATIVE
PH UA: 5.5 (ref 5.0–7.5)
PROTEIN UA: NEGATIVE
Specific Gravity, UA: >1.03 — ABNORMAL HIGH (ref 1.005–1.030)
UUROB: 0.2 mg/dL (ref 0.2–1.0)

## 2015-11-13 LAB — BLADDER SCAN AMB NON-IMAGING

## 2015-11-13 NOTE — Progress Notes (Signed)
11:31 AM   Margaret Durham 03-23-1971 [id number]  Referring provider: Lucille Passy, MD [place], [place] [address]  Chief Complaint  Patient presents with  . Urinary Frequency  . Follow-up    2 month    HPI: Patient is a 45 year old African-American female presenting today as a referral from her primary care provider with complaints of urinary frequency occurring intermittently over the last few months.  Nocturia at least 2 times per night.  She notices symptoms typically become worse at the beginning of her menstrual cycle and she occasionally feels the need to void every 22mins.  No associated urgency, incontinence or gross hematuria or dysuria. No pain with bladder filling. She was provided information on interstitial cystitis diet by her primary care provider but states that she has not looked at the information yet. Denies history heavy or painful periods. States that she only recently starting experiences menstrual cramps.  Never been a smoker. Previous UA unremarkable. No vaginal complaints. No [place] history of GU malignancies.   Current Status: Patient presents today for follow-up on urinary frequency. She states that she has noticed that her symptoms seem to increase in severity when she is not eating very well especially when she eats sweets.  PMH: Past Medical History  Diagnosis Date  . IBS (irritable bowel syndrome)   . MVP (mitral valve prolapse)   . Fibroids 2006  . History of D&C 1986  . Hemangioma 2012    found on CT scan (liver)  . Otitis 2012  . Bacterial vaginosis     recurrent   . Hx of bladder infections   . Hx of migraine headaches   . Bell's palsy   . MVP (mitral valve prolapse)     Surgical History: Past Surgical History  Procedure Laterality Date  . Endometrial biopsy  2012  . Cesarean section  2010  . Tooth extraction      Home Medications:    Medication List    Notice  As of 11/13/2015 11:31 AM   You have not been  prescribed any medications.      Allergies:  Allergies  Allergen Reactions  . Macrobid [Nitrofurantoin Macrocrystal]     Family History: Family History  Problem Relation Age of Onset  . Heart disease Maternal Aunt   . Cancer Maternal Aunt     breast  . Heart disease Maternal Grandmother   . Hypertension Maternal Grandmother   . Diabetes Maternal Grandmother   . Stroke Maternal Grandmother   . Stroke Maternal Grandfather   . Cancer Maternal Grandfather     lung     Social History:  reports that she has never smoked. She has never used smokeless tobacco. She reports that she drinks alcohol. She reports that she does not use illicit drugs.  ROS: UROLOGY Frequent Urination?: Yes Hard to postpone urination?: No Burning/pain with urination?: No Get up at night to urinate?: No Leakage of urine?: No Urine stream starts and stops?: No Trouble starting stream?: No Do you have to strain to urinate?: No Blood in urine?: No Urinary tract infection?: No Sexually transmitted disease?: No Injury to kidneys or bladder?: No Painful intercourse?: No Weak stream?: No Currently pregnant?: No Vaginal bleeding?: No Last menstrual period?: n  Gastrointestinal Nausea?: No Vomiting?: No Indigestion/heartburn?: No Diarrhea?: Yes Constipation?: No  Constitutional Fever: No Night sweats?: No Weight loss?: No Fatigue?: No  Skin Skin rash/lesions?: No Itching?: No  Eyes Blurred vision?: No Double vision?: No  Ears/Nose/Throat  Sore throat?: No Sinus problems?: No  Hematologic/Lymphatic Swollen glands?: No Easy bruising?: No  Cardiovascular Leg swelling?: No Chest pain?: No  Respiratory Cough?: No Shortness of breath?: No  Endocrine Excessive thirst?: No  Musculoskeletal Back pain?: No Joint pain?: No  Neurological Headaches?: No Dizziness?: No  Psychologic Depression?: No Anxiety?: No  Physical Exam: BP 104/71 mmHg  Pulse 64  Resp 16  Ht 5\' 5"   (1.651 m)  Wt 182 lb 12.8 oz (82.918 kg)  BMI 30.42 kg/m2  Constitutional:  Alert and oriented, No acute distress. HEENT:  AT, moist mucus membranes.  Trachea midline, no masses. Cardiovascular: No clubbing, cyanosis, or edema. Respiratory: Normal respiratory effort, no increased work of breathing. GI: Abdomen is soft, nontender, nondistended, no abdominal masses Pelvic exam:  Normal urinary meatus, no lesions, normal vaginal mucosa, no cervical motion tenderness, white normal physiological vaginal discharge, palpable right sided pelvic mass most likely uterine fibroid, no tenderness during bimanual exam Skin: No rashes, bruises or suspicious lesions. Neurologic: Grossly intact, no focal deficits, moving all 4 extremities. Psychiatric: Normal mood and affect.  Laboratory Data:   Urinalysis Results for orders placed or performed in visit on 11/13/15  BLADDER SCAN AMB NON-IMAGING  Result Value Ref Range   Scan Result 163 mL    Pertinent Imaging: 05/25/12 Clinical Data: Followup for liver hemangiomas. Evaluate fibroids.  CT ABDOMEN AND PELVIS WITH CONTRAST  Technique: Multidetector CT imaging of the abdomen and pelvis was performed following the standard protocol during bolus administration of intravenous contrast.  Contrast: 120mL OMNIPAQUE IOHEXOL 300 MG/ML SOLN  Comparison: MRI of the abdomen and pelvis 11/05/2010.  Findings:  Lung Bases: Unremarkable.  Abdomen/Pelvis: Again noted are multiple hepatic lesions which are centrally low attenuation with peripheral nodular enhancement on the portal venous phase images that demonstrate progressive central enhancements and the more delayed phases (only one of these lesions was imaged on the delayed phase which was tailored for evaluation of the kidneys). These lesions are similar in size, with the largest lesion in segment 6 of the liver measuring 2.3 x 2.0 cm. No new hepatic lesions are otherwise noted.  The  enhanced appearance of the gallbladder, pancreas, spleen, bilateral adrenal glands and bilateral kidneys is unremarkable. No ascites or pneumoperitoneum and no definite pathologic distension of bowel. No pathologic lymphadenopathy identified within the abdomen or pelvis. Urinary bladder is unremarkable. Multiple phleboliths are noted in the pelvis.  The uterus is again heterogeneous in appearance with multi focal lesions, most compatible with fibroids. The largest of these extends exophytically from the fundus anteriorly and is heavily calcified measuring 4.6 x 4.6 cm.  Musculoskeletal: There are no aggressive appearing lytic or blastic lesions noted in the visualized portions of the skeleton.  IMPRESSION: 1. Multiple liver lesions redemonstrated, as above. Three of these are compatible with hemangiomas (segments 2, 4A and 6) and are unchanged. An additional lesion in the medial aspect of segment 2 is too small to characterize (but is also unchanged and likely to represent a small cyst.). 2. Fibroid uterus redemonstrated, as above.  Pertinent labs & imaging results that were available during my care of the patient were reviewed by me and considered in my medical decision making (see chart for details).  Assessment & Plan:    1. Urinary frequency- bladder scan picking up to 250 ML's. In and out catheterization performed for confrimation with 0 mL output.  I suspect the bladder scan is picking up another fluid collection within the lower abdomen. Could possibly be  interference from large uterine fibroid. I discussed this with the patient and reviewed her previous CT report and images with her as well. It is a possibility that her large fibroids or applying pressure to her bladder and causing her urinary symptoms. We will order a pelvic ultrasound and have her follow up with her regular GYN provider Dr. Charlesetta Garibaldi. Patient will return in 3 months to reassess her urinary symptoms after  she has been evaluated by GYN. - Urinalysis, Complete - BLADDER SCAN AMB NON-IMAGING  2. Uterine fibroids-  Palpable midline to right-sided pelvic mass on exam nontender. Pelvic ultrasound ordered for further evaluation and patient will follow up with her regular GYN provider to review results.  Return in about 3 months (around 02/11/2016).  These notes generated with voice recognition software. I apologize for typographical errors.  Herbert Moors, [facility] [address] [place], [place] [address] [phone]

## 2015-12-03 ENCOUNTER — Telehealth: Payer: Self-pay

## 2015-12-03 DIAGNOSIS — D259 Leiomyoma of uterus, unspecified: Secondary | ICD-10-CM

## 2015-12-03 NOTE — Telephone Encounter (Signed)
Normally they do which?

## 2015-12-03 NOTE — Telephone Encounter (Signed)
Margaret Durham from scheduling called stating normally do a u/s pelvis complete. They normally do a transvaginal nonOB. Please advise.

## 2015-12-03 NOTE — Telephone Encounter (Signed)
Will you ask for an order number? When I type  it in it doesn't come up.

## 2015-12-03 NOTE — Telephone Encounter (Signed)
Orders have been placed.

## 2015-12-03 NOTE — Telephone Encounter (Signed)
LMOM for Margaret Durham at scheduling to get the img numbers.

## 2015-12-03 NOTE — Telephone Encounter (Signed)
Transvaginal nonOB

## 2015-12-17 ENCOUNTER — Ambulatory Visit (HOSPITAL_COMMUNITY)
Admission: RE | Admit: 2015-12-17 | Discharge: 2015-12-17 | Disposition: A | Discharge status: Home / Self Care | Payer: BC Managed Care – PPO | Source: Ambulatory Visit | Attending: Obstetrics and Gynecology | Admitting: Obstetrics and Gynecology

## 2015-12-17 DIAGNOSIS — R350 Frequency of micturition: Secondary | ICD-10-CM | POA: Insufficient documentation

## 2015-12-17 DIAGNOSIS — D259 Leiomyoma of uterus, unspecified: Secondary | ICD-10-CM | POA: Diagnosis not present

## 2016-01-11 ENCOUNTER — Ambulatory Visit: Payer: BC Managed Care – PPO | Admitting: Family Medicine

## 2016-01-11 ENCOUNTER — Encounter: Payer: Self-pay | Admitting: Adult Health

## 2016-01-11 ENCOUNTER — Ambulatory Visit (INDEPENDENT_AMBULATORY_CARE_PROVIDER_SITE_OTHER): Payer: BC Managed Care – PPO | Admitting: Adult Health

## 2016-01-11 VITALS — BP 110/68 | HR 96 | Temp 98.9°F | Ht 65.0 in | Wt 183.0 lb

## 2016-01-11 DIAGNOSIS — J209 Acute bronchitis, unspecified: Secondary | ICD-10-CM | POA: Diagnosis not present

## 2016-01-11 MED ORDER — PREDNISONE 10 MG PO TABS: 10.0000 mg | ORAL_TABLET | Freq: Every day | ORAL | Status: DC

## 2016-01-11 MED ORDER — HYDROCODONE-HOMATROPINE 5-1.5 MG/5ML PO SYRP: 5.0000 mL | ORAL_SOLUTION | Freq: Three times a day (TID) | ORAL | Status: DC | PRN

## 2016-01-11 NOTE — Patient Instructions (Addendum)
It was great meeting you today!  Your exam is consistent with bronchitis.   I have sent in a prescription for Prednisone  Take as directed:  40 mgx 3 days 20 mg x 3 days 10mg  x 3 days  Use the cough syrup at night as it will make you sleepy.   During the day use Mucinex or Delsym    Acute Bronchitis Bronchitis is inflammation of the airways that extend from the windpipe into the lungs (bronchi). The inflammation often causes mucus to develop. This leads to a cough, which is the most common symptom of bronchitis.  In acute bronchitis, the condition usually develops suddenly and goes away over time, usually in a couple weeks. Smoking, allergies, and asthma can make bronchitis worse. Repeated episodes of bronchitis may cause further lung problems.  CAUSES Acute bronchitis is most often caused by the same virus that causes a cold. The virus can spread from person to person (contagious) through coughing, sneezing, and touching contaminated objects. SIGNS AND SYMPTOMS   Cough.   Fever.   Coughing up mucus.   Body aches.   Chest congestion.   Chills.   Shortness of breath.   Sore throat.  DIAGNOSIS  Acute bronchitis is usually diagnosed through a physical exam. Your health care provider will also ask you questions about your medical history. Tests, such as chest X-rays, are sometimes done to rule out other conditions.  TREATMENT  Acute bronchitis usually goes away in a couple weeks. Oftentimes, no medical treatment is necessary. Medicines are sometimes given for relief of fever or cough. Antibiotic medicines are usually not needed but may be prescribed in certain situations. In some cases, an inhaler may be recommended to help reduce shortness of breath and control the cough. A cool mist vaporizer may also be used to help thin bronchial secretions and make it easier to clear the chest.  HOME CARE INSTRUCTIONS  Get plenty of rest.   Drink enough fluids to keep your  urine clear or pale yellow (unless you have a medical condition that requires fluid restriction). Increasing fluids may help thin your respiratory secretions (sputum) and reduce chest congestion, and it will prevent dehydration.   Take medicines only as directed by your health care provider.  If you were prescribed an antibiotic medicine, finish it all even if you start to feel better.  Avoid smoking and secondhand smoke. Exposure to cigarette smoke or irritating chemicals will make bronchitis worse. If you are a smoker, consider using nicotine gum or skin patches to help control withdrawal symptoms. Quitting smoking will help your lungs heal faster.   Reduce the chances of another bout of acute bronchitis by washing your hands frequently, avoiding people with cold symptoms, and trying not to touch your hands to your mouth, nose, or eyes.   Keep all follow-up visits as directed by your health care provider.  SEEK MEDICAL CARE IF: Your symptoms do not improve after 1 week of treatment.  SEEK IMMEDIATE MEDICAL CARE IF:  You develop an increased fever or chills.   You have chest pain.   You have severe shortness of breath.  You have bloody sputum.   You develop dehydration.  You faint or repeatedly feel like you are going to pass out.  You develop repeated vomiting.  You develop a severe headache. MAKE SURE YOU:   Understand these instructions.  Will watch your condition.  Will get help right away if you are not doing well or get worse.  This information is not intended to replace advice given to you by your health care provider. Make sure you discuss any questions you have with your health care provider.   Document Released: 11/20/2004 Document Revised: 11/03/2014 Document Reviewed: 04/05/2013 Elsevier Interactive Patient Education [employer].

## 2016-01-11 NOTE — Progress Notes (Signed)
Subjective:    Patient ID: Margaret Durham, female    DOB: [birth year], 45 y.o.   MRN: [id number]  Cough This is a new problem. The current episode started 1 to 4 weeks ago. The problem has been unchanged. The problem occurs constantly. The cough is non-productive. Pertinent negatives include no chest pain, fever, headaches, nasal congestion, rhinorrhea, shortness of breath or wheezing. Nothing aggravates the symptoms. She has tried nothing for the symptoms. The treatment provided no relief.    Review of Systems  Constitutional: Negative.  Negative for fever.  HENT: Negative for rhinorrhea.   Respiratory: Positive for cough. Negative for shortness of breath and wheezing.   Cardiovascular: Negative for chest pain.  Neurological: Negative for headaches.   Past Medical History  Diagnosis Date  . IBS (irritable bowel syndrome)   . MVP (mitral valve prolapse)   . Fibroids 2006  . History of D&C 1986  . Hemangioma 2012    found on CT scan (liver)  . Otitis 2012  . Bacterial vaginosis     recurrent   . Hx of bladder infections   . Hx of migraine headaches   . Bell's palsy   . MVP (mitral valve prolapse)     Social History   Social History  . Marital Status: Married    Spouse Name: N/A  . Number of Children: N/A  . Years of Education: N/A   Occupational History  . Not on file.   Social History Main Topics  . Smoking status: Never Smoker   . Smokeless tobacco: Never Used  . Alcohol Use: Yes  . Drug Use: No  . Sexual Activity: Yes    Birth Control/ Protection: None   Other Topics Concern  . Not on file   Social History Narrative    Past Surgical History  Procedure Laterality Date  . Endometrial biopsy  2012  . Cesarean section  2010  . Tooth extraction      Family History  Problem Relation Age of Onset  . Heart disease Maternal Aunt   . Cancer Maternal Aunt     breast  . Heart disease Maternal Grandmother   . Hypertension Maternal Grandmother   .  Diabetes Maternal Grandmother   . Stroke Maternal Grandmother   . Stroke Maternal Grandfather   . Cancer Maternal Grandfather     lung     Allergies  Allergen Reactions  . Macrobid [Nitrofurantoin Macrocrystal]     No current outpatient prescriptions on file prior to visit.   No current facility-administered medications on file prior to visit.    BP 110/68 mmHg  Pulse 96  Temp(Src) 98.9 F (37.2 C) (Oral)  Ht 5\' 5"  (1.651 m)  Wt 183 lb (83.008 kg)  BMI 30.45 kg/m2  SpO2 96%       Objective:   Physical Exam  Constitutional: She is oriented to person, place, and time. She appears well-developed and well-nourished. No distress.  Cardiovascular: Normal rate, regular rhythm, normal heart sounds and intact distal pulses.  Exam reveals no gallop and no friction rub.   No murmur heard. Pulmonary/Chest: Effort normal and breath sounds normal. No respiratory distress. She has no wheezes. She has no rales. She exhibits no tenderness.  Increased expiratory phase  Neurological: She is alert and oriented to person, place, and time.  Skin: Skin is warm and dry. No rash noted. She is not diaphoretic. No erythema. No pallor.  Psychiatric: She has a normal mood and affect. Her behavior is  normal. Judgment and thought content normal.  Vitals reviewed.     Assessment & Plan:  1. Acute bronchitis, unspecified organism - predniSONE (DELTASONE) 10 MG tablet; Take 1 tablet (10 mg total) by mouth daily with breakfast. 40 mgx 3 days, 20mg x3days, 10 mg x 3 days  Dispense: 21 tablet; Refill: 0 - HYDROcodone-homatropine (HYCODAN) 5-1.5 MG/5ML syrup; Take 5 mLs by mouth every 8 (eight) hours as needed for cough.  Dispense: 120 mL; Refill: 0 - Mucinex or Delsym during the day - Follow up if no improvement in 2-3 days

## 2016-01-11 NOTE — Progress Notes (Signed)
Pre visit review using our clinic review tool, if applicable. No additional management support is needed unless otherwise documented below in the visit note. 

## 2016-01-14 ENCOUNTER — Ambulatory Visit: Payer: BC Managed Care – PPO | Admitting: Family Medicine

## 2016-02-11 ENCOUNTER — Ambulatory Visit: Payer: BC Managed Care – PPO | Admitting: Urology

## 2016-07-01 ENCOUNTER — Other Ambulatory Visit: Payer: Self-pay | Admitting: Obstetrics and Gynecology

## 2016-07-01 DIAGNOSIS — Z1231 Encounter for screening mammogram for malignant neoplasm of breast: Secondary | ICD-10-CM

## 2016-07-08 ENCOUNTER — Ambulatory Visit
Admission: RE | Admit: 2016-07-08 | Discharge: 2016-07-08 | Disposition: A | Discharge status: Home / Self Care | Payer: BC Managed Care – PPO | Source: Ambulatory Visit | Attending: Obstetrics and Gynecology | Admitting: Obstetrics and Gynecology

## 2016-07-08 DIAGNOSIS — Z1231 Encounter for screening mammogram for malignant neoplasm of breast: Secondary | ICD-10-CM

## 2016-07-14 ENCOUNTER — Other Ambulatory Visit: Payer: Self-pay | Admitting: Obstetrics and Gynecology

## 2016-07-14 DIAGNOSIS — R928 Other abnormal and inconclusive findings on diagnostic imaging of breast: Secondary | ICD-10-CM

## 2016-07-18 ENCOUNTER — Ambulatory Visit
Admission: RE | Admit: 2016-07-18 | Discharge: 2016-07-18 | Disposition: A | Discharge status: Home / Self Care | Payer: BC Managed Care – PPO | Source: Ambulatory Visit | Attending: Obstetrics and Gynecology | Admitting: Obstetrics and Gynecology

## 2016-07-18 DIAGNOSIS — R928 Other abnormal and inconclusive findings on diagnostic imaging of breast: Secondary | ICD-10-CM

## 2017-08-10 ENCOUNTER — Other Ambulatory Visit: Payer: Self-pay | Admitting: Family Medicine

## 2017-08-10 DIAGNOSIS — Z01419 Encounter for gynecological examination (general) (routine) without abnormal findings: Secondary | ICD-10-CM

## 2017-08-19 ENCOUNTER — Other Ambulatory Visit (INDEPENDENT_AMBULATORY_CARE_PROVIDER_SITE_OTHER): Payer: BC Managed Care – PPO

## 2017-08-19 DIAGNOSIS — Z01419 Encounter for gynecological examination (general) (routine) without abnormal findings: Secondary | ICD-10-CM | POA: Diagnosis not present

## 2017-08-19 LAB — CBC WITH DIFFERENTIAL/PLATELET
BASOS PCT: 0.7 % (ref 0.0–3.0)
Basophils Absolute: 0 10*3/uL (ref 0.0–0.1)
EOS ABS: 0.1 10*3/uL (ref 0.0–0.7)
Eosinophils Relative: 1.4 % (ref 0.0–5.0)
HCT: 36.7 % (ref 36.0–46.0)
HEMOGLOBIN: 12.5 g/dL (ref 12.0–15.0)
Lymphocytes Relative: 34.3 % (ref 12.0–46.0)
Lymphs Abs: 1.7 10*3/uL (ref 0.7–4.0)
MCHC: 34 g/dL (ref 30.0–36.0)
MCV: 90 fl (ref 78.0–100.0)
MONO ABS: 0.5 10*3/uL (ref 0.1–1.0)
Monocytes Relative: 10.4 % (ref 3.0–12.0)
NEUTROS ABS: 2.7 10*3/uL (ref 1.4–7.7)
NEUTROS PCT: 53.2 % (ref 43.0–77.0)
Platelets: 343 10*3/uL (ref 150.0–400.0)
RBC: 4.08 Mil/uL (ref 3.87–5.11)
RDW: 14.4 % (ref 11.5–15.5)
WBC: 5 10*3/uL (ref 4.0–10.5)

## 2017-08-19 LAB — LIPID PANEL
CHOLESTEROL: 161 mg/dL (ref 0–200)
HDL: 58.4 mg/dL (ref >39.00)
LDL CALC: 93 mg/dL (ref 0–99)
NONHDL: 103.01
Total CHOL/HDL Ratio: 3
Triglycerides: 49 mg/dL (ref 0.0–149.0)
VLDL: 9.8 mg/dL (ref 0.0–40.0)

## 2017-08-19 LAB — TSH: TSH: 1.15 u[IU]/mL (ref 0.35–4.50)

## 2017-08-19 LAB — COMPREHENSIVE METABOLIC PANEL
ALBUMIN: 4 g/dL (ref 3.5–5.2)
ALT: 11 U/L (ref 0–35)
AST: 17 U/L (ref 0–37)
Alkaline Phosphatase: 50 U/L (ref 39–117)
BUN: 9 mg/dL (ref 6–23)
CO2: 28 mEq/L (ref 19–32)
CREATININE: 0.9 mg/dL (ref 0.40–1.20)
Calcium: 9.3 mg/dL (ref 8.4–10.5)
Chloride: 103 mEq/L (ref 96–112)
GFR: 85.87 mL/min (ref >60.00)
GLUCOSE: 93 mg/dL (ref 70–99)
POTASSIUM: 4.1 meq/L (ref 3.5–5.1)
SODIUM: 138 meq/L (ref 135–145)
TOTAL PROTEIN: 6.6 g/dL (ref 6.0–8.3)
Total Bilirubin: 0.4 mg/dL (ref 0.2–1.2)

## 2017-08-25 ENCOUNTER — Ambulatory Visit (INDEPENDENT_AMBULATORY_CARE_PROVIDER_SITE_OTHER): Payer: BC Managed Care – PPO | Admitting: Family Medicine

## 2017-08-25 ENCOUNTER — Encounter: Payer: Self-pay | Admitting: Family Medicine

## 2017-08-25 VITALS — BP 100/60 | HR 88 | Temp 98.3°F | Ht 65.0 in | Wt 186.1 lb

## 2017-08-25 DIAGNOSIS — N3281 Overactive bladder: Secondary | ICD-10-CM | POA: Diagnosis not present

## 2017-08-25 DIAGNOSIS — Z01419 Encounter for gynecological examination (general) (routine) without abnormal findings: Secondary | ICD-10-CM | POA: Diagnosis not present

## 2017-08-25 DIAGNOSIS — R351 Nocturia: Secondary | ICD-10-CM | POA: Diagnosis not present

## 2017-08-25 MED ORDER — TOLTERODINE TARTRATE 1 MG PO TABS: 1.0000 mg | ORAL_TABLET | Freq: Every day | ORAL | 3 refills | Status: DC

## 2017-08-25 NOTE — Assessment & Plan Note (Signed)
Will try low dose detrol, 1 mg nightly at bedtime. Call or return to clinic prn if these symptoms worsen or fail to improve as anticipated. The patient indicates understanding of these issues and agrees with the plan.

## 2017-08-25 NOTE — Patient Instructions (Signed)
Great to see you.  We are starting Detrol 1 mg nightly at bedtime.  Try Pepcid 20 mg daily for your reflux symptoms.

## 2017-08-25 NOTE — Assessment & Plan Note (Signed)
Reviewed preventive care protocols, scheduled due services, and updated immunizations Discussed nutrition, exercise, diet, and healthy lifestyle.  

## 2017-08-25 NOTE — Progress Notes (Signed)
Subjective:   Patient ID: Margaret Durham, female    DOB: [birth year], 46 y.o.   MRN: [id number]  Margaret Durham is a pleasant 46 y.o. year old female who presents to clinic today with Annual Exam (Patient is here today for a CPE without PAP.  Had fasting labs completed.  She states that she has nocturia and gets up several times at night.  She also states that she has a bruised area on her left front side of her ankle without injury that she can recall.)  on 08/25/2017  HPI:  G1P1- has OBGYN- Dr. Charlesetta Garibaldi.  Son is 12 yo.  She is a professor at [employer], physically active.  Runs and does Zumba.  Mammogram and pap smear UTD.  Nocturia- has been getting up several times a night.  This has been ongoing for years. Saw a urologist two years ago. Was told work up was neg- ? OAB. Lab Results  Component Value Date   CHOL 161 08/19/2017   HDL 58.40 08/19/2017   LDLCALC 93 08/19/2017   TRIG 49.0 08/19/2017   CHOLHDL 3 08/19/2017   Lab Results  Component Value Date   CREATININE 0.90 08/19/2017   Lab Results  Component Value Date   TSH 1.15 08/19/2017   Lab Results  Component Value Date   WBC 5.0 08/19/2017   HGB 12.5 08/19/2017   HCT 36.7 08/19/2017   MCV 90.0 08/19/2017   PLT 343.0 08/19/2017   Lab Results  Component Value Date   ALT 11 08/19/2017   AST 17 08/19/2017   ALKPHOS 50 08/19/2017   BILITOT 0.4 08/19/2017   Lab Results  Component Value Date   NA 138 08/19/2017   K 4.1 08/19/2017   CL 103 08/19/2017   CO2 28 08/19/2017    No current outpatient prescriptions on file prior to visit.   No current facility-administered medications on file prior to visit.     Allergies  Allergen Reactions  . Macrobid [Nitrofurantoin Macrocrystal]     Past Medical History:  Diagnosis Date  . Bacterial vaginosis    recurrent   . Bell's palsy   . Fibroids 2006  . Hemangioma 2012   found on CT scan (liver)  . History of D&C 1986  . Hx of bladder infections   .  Hx of migraine headaches   . IBS (irritable bowel syndrome)   . MVP (mitral valve prolapse)   . MVP (mitral valve prolapse)   . Otitis 2012    Past Surgical History:  Procedure Laterality Date  . CESAREAN SECTION  2010  . ENDOMETRIAL BIOPSY  2012  . TOOTH EXTRACTION      Family History  Problem Relation Age of Onset  . Heart disease Maternal Aunt   . Cancer Maternal Aunt        breast  . Heart disease Maternal Grandmother   . Hypertension Maternal Grandmother   . Diabetes Maternal Grandmother   . Stroke Maternal Grandmother   . Stroke Maternal Grandfather   . Cancer Maternal Grandfather        lung     Social History   Social History  . Marital status: Married    Spouse name: N/A  . Number of children: N/A  . Years of education: N/A   Occupational History  . Not on file.   Social History Main Topics  . Smoking status: Never Smoker  . Smokeless tobacco: Never Used  . Alcohol use Yes  . Drug  use: No  . Sexual activity: Yes    Birth control/ protection: None   Other Topics Concern  . Not on file   Social History Narrative  . No narrative on file   The PMH, PSH, Social History, Family History, Medications, and allergies have been reviewed in [facility], and have been updated if relevant.   Review of Systems  Constitutional: Negative.   HENT: Negative.   Eyes: Negative.   Respiratory: Negative.   Cardiovascular: Negative.   Gastrointestinal: Negative.        + gerd  Endocrine: Negative.   Genitourinary: Positive for frequency and vaginal discharge. Negative for difficulty urinating, dyspareunia, flank pain and vaginal pain.  Musculoskeletal: Negative.   Skin: Negative.   Allergic/Immunologic: Negative.   Neurological: Negative.   Hematological: Negative.   Psychiatric/Behavioral: Negative.        Objective:    BP 100/60 (BP Location: Right Arm, Patient Position: Sitting, Cuff Size: Normal)   Pulse 88   Temp 98.3 F (36.8 C) (Oral)   Ht 5\' 5"  (1.651  m)   Wt 186 lb 1.9 oz (84.4 kg)   LMP 08/24/2017   SpO2 99%   BMI 30.97 kg/m    Physical Exam   General:  Well-developed,well-nourished,in no acute distress; alert,appropriate and cooperative throughout examination Head:  normocephalic and atraumatic.   Eyes:  vision grossly intact, PERRL Ears:  R ear normal and L ear normal externally, TMs clear bilaterally Nose:  no external deformity.   Mouth:  good dentition.   Neck:  No deformities, masses, or tenderness noted. Breasts:  No mass, nodules, thickening, tenderness, bulging, retraction, inflamation, nipple discharge or skin changes noted.   Lungs:  Normal respiratory effort, chest expands symmetrically. Lungs are clear to auscultation, no crackles or wheezes. Heart:  Normal rate and regular rhythm. S1 and S2 normal without gallop, murmur, click, rub or other extra sounds. Abdomen:  Bowel sounds positive,abdomen soft and non-tender without masses, organomegaly or hernias noted. Msk:  No deformity or scoliosis noted of thoracic or lumbar spine.   Extremities:  No clubbing, cyanosis, edema, or deformity noted with normal full range of motion of all joints.   Neurologic:  alert & oriented X3 and gait normal.   Skin:  Intact without suspicious lesions or rashes Cervical Nodes:  No lymphadenopathy noted Axillary Nodes:  No palpable lymphadenopathy Psych:  Cognition and judgment appear intact. Alert and cooperative with normal attention span and concentration. No apparent delusions, illusions, hallucinations       Assessment & Plan:   Well woman exam  Nocturia  OAB (overactive bladder) No Follow-up on file.

## 2018-03-11 ENCOUNTER — Encounter: Payer: Self-pay | Admitting: Podiatry

## 2018-03-11 ENCOUNTER — Ambulatory Visit: Payer: BC Managed Care – PPO | Admitting: Podiatry

## 2018-03-11 ENCOUNTER — Ambulatory Visit (INDEPENDENT_AMBULATORY_CARE_PROVIDER_SITE_OTHER): Payer: BC Managed Care – PPO

## 2018-03-11 VITALS — BP 94/67 | HR 90

## 2018-03-11 DIAGNOSIS — D1803 Hemangioma of intra-abdominal structures: Secondary | ICD-10-CM | POA: Insufficient documentation

## 2018-03-11 DIAGNOSIS — M2010 Hallux valgus (acquired), unspecified foot: Secondary | ICD-10-CM | POA: Diagnosis not present

## 2018-03-11 DIAGNOSIS — G43909 Migraine, unspecified, not intractable, without status migrainosus: Secondary | ICD-10-CM | POA: Insufficient documentation

## 2018-03-11 DIAGNOSIS — G43109 Migraine with aura, not intractable, without status migrainosus: Secondary | ICD-10-CM | POA: Insufficient documentation

## 2018-03-11 DIAGNOSIS — I341 Nonrheumatic mitral (valve) prolapse: Secondary | ICD-10-CM | POA: Insufficient documentation

## 2018-03-11 DIAGNOSIS — K589 Irritable bowel syndrome without diarrhea: Secondary | ICD-10-CM | POA: Insufficient documentation

## 2018-03-11 NOTE — Patient Instructions (Signed)

## 2018-03-14 NOTE — Progress Notes (Signed)
Subjective:   Patient ID: Margaret Durham, female   DOB: 47 y.o.   MRN: [id number]   HPI Patient presents stating she is had increased pain around the big toe joint right over the last 6 months and is been present for a number of years and gradually is making shoe gear difficult to wear.  Patient does not smoke likes to be active   Review of Systems  All other systems reviewed and are negative.       Objective:  Physical Exam  Constitutional: She appears well-developed and well-nourished.  Cardiovascular: Intact distal pulses.  Pulmonary/Chest: Effort normal.  Musculoskeletal: Normal range of motion.  Neurological: She is alert.  Skin: Skin is warm.  Nursing note and vitals reviewed.   Neurovascular status intact muscle strength adequate range of motion within normal limits with patient found to have hyperostosis medial aspect first metatarsal head right is red and is also noted to have deviation of the big toe against the second toe right.  Patient has good digital perfusion is well oriented x3     Assessment:  Chronic structural bunion deformity right with inflammation pain around the medial side with redness     Plan:  H&P x-rays reviewed condition discussed at great length with education rendered to patient.  I have recommended long-term structural correction I reviewed with her distal osteotomy in her x-rays and she wants to do this but needs to find the right time and will schedule and be seen back prior to that event  X-rays indicate elevation of the intermetatarsal angle with mild deviation of the hallux against the second toe

## 2018-03-16 ENCOUNTER — Telehealth: Payer: Self-pay | Admitting: *Deleted

## 2018-03-16 NOTE — Telephone Encounter (Signed)
"  I was calling regarding scheduling a surgery.  Give me a call back."

## 2018-03-23 NOTE — Telephone Encounter (Signed)
"  I called last Monday and no one ever called me back.  I'd like to schedule my surgery."  Do you have a date in mind?  "I'd like to see what he has available first."  He can do it as soon as May 28 or June 4.  "Oh that soon,  I know he said I would be out for about a week.  Will I be up and running after that?"  No, you will not be up and running.  Do you sit or stand on your job.  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  I can sit but during the summer, I am up a little more because we have several orientations during the summer."  Well the more you are up on your feet, you risk the chance of getting swelling.  The swelling can cause pain.  "Okay, I think I better wait until the late Fall to schedule.  What do you have in September?" He can do it on September 24.  "That date will be fine."  You will need to schedule an appointment with Dr. Paulla Dolly for a consult.  Would you like to schedule that appointment now?  "Yes, I would."  I will transfer you to an appointment scheduler.  The call was transferred to [facility].

## 2018-04-14 ENCOUNTER — Ambulatory Visit: Payer: BC Managed Care – PPO | Admitting: Podiatry

## 2018-05-18 ENCOUNTER — Telehealth: Payer: Self-pay | Admitting: *Deleted

## 2018-05-18 NOTE — Telephone Encounter (Signed)
I left the patient a message to give me a call to reschedule her surgery that is scheduled for 07/20/2018.  I told her that Dr. Paulla Dolly will not be doing surgery on 07/20/2018 nor 07/13/2018.

## 2018-06-10 NOTE — Telephone Encounter (Signed)
"  I received a call a couple of weeks ago saying that Dr. Paulla Dolly could not perform surgery on the day that was originally scheduled.  So I was just calling to reschedule that."

## 2018-06-16 NOTE — Telephone Encounter (Signed)
I got your message.  I'm calling to reschedule my surgery."  Do you have a date in mind?  "Is he doing surgery in December?"  Yes, he is doing surgery in December.  Do you have a date in mind?  "Does he do surgery on any day?"  He does surgery on Tuesdays.  "Can he do it on December 10?"  Yes, he can do it on December 10.  I will get it scheduled.  Someone from the surgical center will give you a call the Friday or Monday prior to your surgery date.  They will give you your arrival time.

## 2018-06-25 ENCOUNTER — Telehealth: Payer: Self-pay | Admitting: Podiatry

## 2018-06-25 NOTE — Telephone Encounter (Signed)
Called pt and left a message on home answering machine to schedule an appointment. Told her we have her down to have surgery on 05 October 2018 and we need her to schedule an appointment at least two weeks before for a surgery consult to go over the surgery and sign the consent forms. Asked pt to call us back at [phone] to schedule the appointment.

## 2018-08-09 ENCOUNTER — Encounter: Payer: Self-pay | Admitting: Podiatry

## 2018-08-09 ENCOUNTER — Ambulatory Visit: Payer: BC Managed Care – PPO | Admitting: Podiatry

## 2018-08-09 ENCOUNTER — Ambulatory Visit (INDEPENDENT_AMBULATORY_CARE_PROVIDER_SITE_OTHER): Payer: BC Managed Care – PPO

## 2018-08-09 DIAGNOSIS — M2011 Hallux valgus (acquired), right foot: Secondary | ICD-10-CM

## 2018-08-09 NOTE — Patient Instructions (Signed)
Pre-Operative Instructions  Congratulations, you have decided to take an important step towards improving your quality of life.  You can be assured that the doctors and staff at [facility] will be with you every step of the way.  Here are some important things you should know:  1. Plan to be at the surgery center/hospital at least 1 (one) hour prior to your scheduled time, unless otherwise directed by the surgical center/hospital staff.  You must have a responsible adult accompany you, remain during the surgery and drive you home.  Make sure you have directions to the surgical center/hospital to ensure you arrive on time. 2. If you are having surgery at [facility] or [facility] hospitals, you will need a copy of your medical history and physical form from your family physician within one month prior to the date of surgery. We will give you a form for your primary physician to complete.  3. We make every effort to accommodate the date you request for surgery.  However, there are times where surgery dates or times have to be moved.  We will contact you as soon as possible if a change in schedule is required.   4. No aspirin/ibuprofen for one week before surgery.  If you are on aspirin, any non-steroidal anti-inflammatory medications (Mobic, Aleve, Ibuprofen) should not be taken seven (7) days prior to your surgery.  You make take Tylenol for pain prior to surgery.  5. Medications - If you are taking daily heart and blood pressure medications, seizure, reflux, allergy, asthma, anxiety, pain or diabetes medications, make sure you notify the surgery center/hospital before the day of surgery so they can tell you which medications you should take or avoid the day of surgery. 6. No food or drink after midnight the night before surgery unless directed otherwise by surgical center/hospital staff. 7. No alcoholic beverages 24-hours prior to surgery.  No smoking 24-hours prior or 24-hours after  surgery. 8. Wear loose pants or shorts. They should be loose enough to fit over bandages, boots, and casts. 9. Don't wear slip-on shoes. Sneakers are preferred. 10. Bring your boot with you to the surgery center/hospital.  Also bring crutches or a walker if your physician has prescribed it for you.  If you do not have this equipment, it will be provided for you after surgery. 11. If you have not been contacted by the surgery center/hospital by the day before your surgery, call to confirm the date and time of your surgery. 12. Leave-time from work may vary depending on the type of surgery you have.  Appropriate arrangements should be made prior to surgery with your employer. 13. Prescriptions will be provided immediately following surgery by your doctor.  Fill these as soon as possible after surgery and take the medication as directed. Pain medications will not be refilled on weekends and must be approved by the doctor. 14. Remove nail polish on the operative foot and avoid getting pedicures prior to surgery. 15. Wash the night before surgery.  The night before surgery wash the foot and leg well with water and the antibacterial soap provided. Be sure to pay special attention to beneath the toenails and in between the toes.  Wash for at least three (3) minutes. Rinse thoroughly with water and dry well with a towel.  Perform this wash unless told not to do so by your physician.  Enclosed: 1 Ice pack (please put in freezer the night before surgery)   1 Hibiclens skin cleaner     Pre-op instructions  If you have any questions regarding the instructions, please do not hesitate to call our office.  [place]: [address], , [place] [address] -- [phone]  [place]: [address] [place], [place] [address] -- [phone]  [place]: [address]  [place], [place] [address] -- [phone]  [place]: [address], [address], [place], [place] [address] -- [phone]  Website:  [email or web address] 

## 2018-08-11 NOTE — Progress Notes (Signed)
Subjective:   Patient ID: Margaret Durham, female   DOB: 47 y.o.   MRN: [id number]   HPI Patient presents to sign consent form for chronic bunion deformity right that is been giving her a lot of problems and making walking in shoe gear difficult   ROS      Objective:  Physical Exam  Neurovascular status intact muscle strength adequate with patient's right first metatarsal showing redness and pain around the joint surface with mild on the left.  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  Patient read the consent form and signed and we reviewed and went over all questions and patient understands total recovery can take 6 months to 1 year and today I did dispense air fracture walker for the postoperative.  Explained her how to use it and to get used to it prior to surgery.  Patient is encouraged to call with any questions prior to procedure  X-ray indicates significant structural bunion deformity right with elevation of the intermetatarsal angle and deviation of the hallux of a mild degree

## 2018-09-16 ENCOUNTER — Ambulatory Visit (INDEPENDENT_AMBULATORY_CARE_PROVIDER_SITE_OTHER): Payer: BC Managed Care – PPO

## 2018-09-16 DIAGNOSIS — Z23 Encounter for immunization: Secondary | ICD-10-CM | POA: Diagnosis not present

## 2018-10-05 ENCOUNTER — Encounter: Payer: Self-pay | Admitting: Podiatry

## 2018-10-05 DIAGNOSIS — M2011 Hallux valgus (acquired), right foot: Secondary | ICD-10-CM

## 2018-10-11 ENCOUNTER — Ambulatory Visit (INDEPENDENT_AMBULATORY_CARE_PROVIDER_SITE_OTHER): Payer: BC Managed Care – PPO

## 2018-10-11 ENCOUNTER — Ambulatory Visit (INDEPENDENT_AMBULATORY_CARE_PROVIDER_SITE_OTHER): Payer: BC Managed Care – PPO | Admitting: Podiatry

## 2018-10-11 VITALS — Temp 99.2°F

## 2018-10-11 DIAGNOSIS — M2011 Hallux valgus (acquired), right foot: Secondary | ICD-10-CM

## 2018-10-11 NOTE — Progress Notes (Signed)
Subjective:   Patient ID: Margaret Durham, female   DOB: 47 y.o.   MRN: [id number]   HPI Patient states foot feels fine and the pain medication did not do well for her but she is not having to take it   ROS      Objective:  Physical Exam  Neurovascular status intact with distal osteotomy first metatarsal right healing well wound edges well coapted hallux in rectus position with good range of motion with no crepitus of the joint     Assessment:  Excellent clinical presentation of structural bunion correction right with wound edges well coapted good alignment     Plan:  H&P x-rays reviewed and advised on keeping the toe in a straight position and we did discuss possibilities for varus which I do not think will occur but we are getting continue to over valgus the toe.  Patient had sterile dressing reapplied continue immobilization elevation compression and reappoint to recheck in approximately 3 weeks or earlier if needed  X-rays indicate the osteotomy is healing well with very minimal overcorrection but the joint congruence and I do not think there be any pathology associated with this but I will keep an eye on it.  Fixation in place

## 2018-10-28 ENCOUNTER — Ambulatory Visit (INDEPENDENT_AMBULATORY_CARE_PROVIDER_SITE_OTHER): Payer: BC Managed Care – PPO | Admitting: Podiatry

## 2018-10-28 ENCOUNTER — Ambulatory Visit (INDEPENDENT_AMBULATORY_CARE_PROVIDER_SITE_OTHER): Payer: BC Managed Care – PPO

## 2018-10-28 ENCOUNTER — Other Ambulatory Visit: Payer: Self-pay | Admitting: Podiatry

## 2018-10-28 DIAGNOSIS — M2011 Hallux valgus (acquired), right foot: Secondary | ICD-10-CM

## 2018-10-28 DIAGNOSIS — Z9889 Other specified postprocedural states: Secondary | ICD-10-CM

## 2018-10-28 NOTE — Progress Notes (Signed)
  Subjective:  Patient ID: Margaret Durham, female    DOB: May 31, 1971,  MRN: [id number]  Chief Complaint  Patient presents with  . Routine Post Op     dos 12.10.2019 [name] Rt   "Its still numb and feels stiff and hard"    DOS: 10/05/18 Procedure: [name] Bunionectomy  48 y.o. female returns for post-op check. Reports some numbness of the lateral side of the great toe. Otherwise not having any pain and is wearing her surgical shoe.  Review of Systems: Negative except as noted in the HPI. Denies N/V/F/Ch.  Past Medical History:  Diagnosis Date  . Bacterial vaginosis    recurrent   . Bell's palsy   . Fibroids 2006  . Hemangioma 2012   found on CT scan (liver)  . History of D&C 1986  . Hx of bladder infections   . Hx of migraine headaches   . IBS (irritable bowel syndrome)   . MVP (mitral valve prolapse)   . MVP (mitral valve prolapse)   . Otitis 2012    Current Outpatient Medications:  .  omeprazole (PRILOSEC) 20 MG capsule, , Disp: , Rfl:  .  tolterodine (DETROL) 1 MG tablet, Take 1 tablet (1 mg total) by mouth at bedtime., Disp: 30 tablet, Rfl: 3  Social History   Tobacco Use  Smoking Status Never Smoker  Smokeless Tobacco Never Used    Allergies  Allergen Reactions  . Macrobid [Nitrofurantoin Macrocrystal]    Objective:  There were no vitals filed for this visit. There is no height or weight on file to calculate BMI. Constitutional Well developed. Well nourished.  Vascular Foot warm and well perfused. Capillary refill normal to all digits.   Neurologic Normal speech. Oriented to person, place, and time. Epicritic sensation to light touch grossly present bilaterally. Slight hypoesthesia lateral surface right great toe.  Dermatologic Skin well healed with small scabbing.  Orthopedic: No tenderness to palpation noted about the surgical site. Good ROM R 1st MPJ.    Radiographs: Taken and reviewed. Hardware intact. Osteotomy still evident  without definite bridging dorsally. Assessment:   1. Hallux valgus of right foot   2. Status post right foot surgery    Plan:  Patient was evaluated and treated and all questions answered.  S/p foot surgery right -Progressing as expected post-operatively. -XR: As above -WB Status: WBAT in surgical shoe -Sutures: out. -Medications: None refilled today. -Foot redressed.  Return in about 4 weeks (around 11/25/2018) for regal patient, POV w/ xray.

## 2018-11-11 ENCOUNTER — Encounter: Payer: BC Managed Care – PPO | Admitting: Podiatry

## 2018-11-12 ENCOUNTER — Ambulatory Visit (INDEPENDENT_AMBULATORY_CARE_PROVIDER_SITE_OTHER): Payer: BC Managed Care – PPO

## 2018-11-12 ENCOUNTER — Ambulatory Visit (INDEPENDENT_AMBULATORY_CARE_PROVIDER_SITE_OTHER): Payer: BC Managed Care – PPO | Admitting: Podiatry

## 2018-11-12 ENCOUNTER — Encounter: Payer: Self-pay | Admitting: Podiatry

## 2018-11-12 DIAGNOSIS — M2011 Hallux valgus (acquired), right foot: Secondary | ICD-10-CM | POA: Diagnosis not present

## 2018-11-12 NOTE — Progress Notes (Signed)
Subjective:   Patient ID: Margaret Durham, female   DOB: 48 y.o.   MRN: [id number]   HPI Patient states overall doing very well with slight concerned because the big toes touching the second toe but overall incisions healing well with good range of motion   ROS      Objective:  Physical Exam  Neurovascular status intact with patient's wound edge and site well coapted first MPJ with good alignment with good range of motion noted     Assessment:  Doing well post Austin osteotomy right with natural slight movement hallux against second toe with excellent range of motion     Plan:  PA x-rays reviewed and explained continued range of motion and the importance of working on this.  Dispensed compression stocking and advised increase activity levels and return to shoe gear and reappoint 6 to 8 weeks or earlier if needed  X-ray indicates osteotomies healing well fixation in place joint congruence

## 2019-01-07 ENCOUNTER — Encounter: Payer: BC Managed Care – PPO | Admitting: Podiatry

## 2019-01-14 ENCOUNTER — Encounter: Payer: BC Managed Care – PPO | Admitting: Podiatry

## 2019-07-26 ENCOUNTER — Encounter: Payer: Self-pay | Admitting: Family Medicine

## 2019-07-26 ENCOUNTER — Other Ambulatory Visit: Payer: Self-pay

## 2019-07-26 ENCOUNTER — Ambulatory Visit (INDEPENDENT_AMBULATORY_CARE_PROVIDER_SITE_OTHER): Payer: BC Managed Care – PPO | Admitting: Family Medicine

## 2019-07-26 VITALS — BP 106/68 | HR 88 | Temp 98.6°F | Ht 65.0 in | Wt 190.2 lb

## 2019-07-26 DIAGNOSIS — N3281 Overactive bladder: Secondary | ICD-10-CM

## 2019-07-26 DIAGNOSIS — R5383 Other fatigue: Secondary | ICD-10-CM | POA: Insufficient documentation

## 2019-07-26 DIAGNOSIS — R351 Nocturia: Secondary | ICD-10-CM | POA: Diagnosis not present

## 2019-07-26 DIAGNOSIS — Z0000 Encounter for general adult medical examination without abnormal findings: Secondary | ICD-10-CM

## 2019-07-26 DIAGNOSIS — Z13220 Encounter for screening for lipoid disorders: Secondary | ICD-10-CM

## 2019-07-26 DIAGNOSIS — Z23 Encounter for immunization: Secondary | ICD-10-CM | POA: Diagnosis not present

## 2019-07-26 DIAGNOSIS — Z01419 Encounter for gynecological examination (general) (routine) without abnormal findings: Secondary | ICD-10-CM

## 2019-07-26 LAB — URINALYSIS, ROUTINE W REFLEX MICROSCOPIC
Bilirubin Urine: NEGATIVE
Hgb urine dipstick: NEGATIVE
Ketones, ur: NEGATIVE
Leukocytes,Ua: NEGATIVE
Nitrite: NEGATIVE
RBC / HPF: NONE SEEN (ref 0–?)
Specific Gravity, Urine: 1.02 (ref 1.000–1.030)
Total Protein, Urine: NEGATIVE
Urine Glucose: NEGATIVE
Urobilinogen, UA: 1 (ref 0.0–1.0)
WBC, UA: NONE SEEN (ref 0–?)
pH: 6.5 (ref 5.0–8.0)

## 2019-07-26 LAB — LIPID PANEL
Cholesterol: 177 mg/dL (ref 0–200)
HDL: 66.6 mg/dL (ref >39.00)
LDL Cholesterol: 98 mg/dL (ref 0–99)
NonHDL: 110.69
Total CHOL/HDL Ratio: 3
Triglycerides: 65 mg/dL (ref 0.0–149.0)
VLDL: 13 mg/dL (ref 0.0–40.0)

## 2019-07-26 LAB — CBC WITH DIFFERENTIAL/PLATELET
Basophils Absolute: 0 10*3/uL (ref 0.0–0.1)
Basophils Relative: 0.7 % (ref 0.0–3.0)
Eosinophils Absolute: 0 10*3/uL (ref 0.0–0.7)
Eosinophils Relative: 0.8 % (ref 0.0–5.0)
HCT: 36.6 % (ref 36.0–46.0)
Hemoglobin: 11.9 g/dL — ABNORMAL LOW (ref 12.0–15.0)
Lymphocytes Relative: 31 % (ref 12.0–46.0)
Lymphs Abs: 1.8 10*3/uL (ref 0.7–4.0)
MCHC: 32.4 g/dL (ref 30.0–36.0)
MCV: 88.8 fl (ref 78.0–100.0)
Monocytes Absolute: 0.5 10*3/uL (ref 0.1–1.0)
Monocytes Relative: 9.1 % (ref 3.0–12.0)
Neutro Abs: 3.3 10*3/uL (ref 1.4–7.7)
Neutrophils Relative %: 58.4 % (ref 43.0–77.0)
Platelets: 355 10*3/uL (ref 150.0–400.0)
RBC: 4.13 Mil/uL (ref 3.87–5.11)
RDW: 15.1 % (ref 11.5–15.5)
WBC: 5.6 10*3/uL (ref 4.0–10.5)

## 2019-07-26 LAB — COMPREHENSIVE METABOLIC PANEL
ALT: 13 U/L (ref 0–35)
AST: 19 U/L (ref 0–37)
Albumin: 4.1 g/dL (ref 3.5–5.2)
Alkaline Phosphatase: 81 U/L (ref 39–117)
BUN: 9 mg/dL (ref 6–23)
CO2: 27 mEq/L (ref 19–32)
Calcium: 9.3 mg/dL (ref 8.4–10.5)
Chloride: 103 mEq/L (ref 96–112)
Creatinine, Ser: 0.81 mg/dL (ref 0.40–1.20)
GFR: 90.51 mL/min (ref >60.00)
Glucose, Bld: 79 mg/dL (ref 70–99)
Potassium: 3.7 mEq/L (ref 3.5–5.1)
Sodium: 138 mEq/L (ref 135–145)
Total Bilirubin: 0.5 mg/dL (ref 0.2–1.2)
Total Protein: 6.8 g/dL (ref 6.0–8.3)

## 2019-07-26 LAB — TSH: TSH: 0.43 u[IU]/mL (ref 0.35–4.50)

## 2019-07-26 LAB — VITAMIN D 25 HYDROXY (VIT D DEFICIENCY, FRACTURES): VITD: 19.88 ng/mL — ABNORMAL LOW (ref 30.00–100.00)

## 2019-07-26 MED ORDER — MIRABEGRON ER 25 MG PO TB24: 25.0000 mg | ORAL_TABLET | Freq: Every day | ORAL | 3 refills | Status: DC

## 2019-07-26 MED ORDER — OMEPRAZOLE 20 MG PO CPDR: 20.0000 mg | DELAYED_RELEASE_CAPSULE | Freq: Every day | ORAL | 3 refills | Status: DC

## 2019-07-26 NOTE — Assessment & Plan Note (Signed)
Likely due to nocturia but will check additional labs as well that could be contributing to fatigue. The patient indicates understanding of these issues and agrees with the plan.

## 2019-07-26 NOTE — Assessment & Plan Note (Signed)
Discussed other options- she agrees to trial with Myrbetriq. eRx sent. Call or send my chart message prn if these symptoms worsen or fail to improve as anticipated. Will also check UA today.

## 2019-07-26 NOTE — Patient Instructions (Signed)
Great to see you. I will call you with your lab results from today and you can view them online.   We are ordering cologuard for you. It will be mailed directly to your home.  We are starting Myrbetriq 25 mg (low dose)- keep me updated please with how you are doing.

## 2019-07-26 NOTE — Progress Notes (Signed)
Subjective:   Patient ID: Margaret Durham, female    DOB: [birth year], 48 y.o.   MRN: [id number]  Margaret Durham is a pleasant 48 y.o. year old female who presents to clinic today with Annual Exam (Pt is here today for a CPE withoug PAP. Pt had coffee with stevia in it only this am. She agrees to flu shot and Tdap.  She is unsure if she would like to have GI referral or Cologuard. She has an appt for mammogram in October at [facility].)  on 07/26/2019  HPI: Health Maintenance  Topic Date Due  . TETANUS/TDAP  06/01/1988  . INFLUENZA VACCINE  05/28/2019  . COLONOSCOPY  06/02/2019  . MAMMOGRAM  08/02/2019 (Originally 06/02/2019)  . PAP SMEAR-Modifier  02/23/2021  . HIV Screening  Discontinued   Has GYN- ? Dr. Charlesetta Garibaldi.  She has an appointment with her OBGYN and for mammogram in October.  Has an 3 year old son.  Is now in admissions at [facility]- working remotely.  Has never had a colonoscopy or cologuard.  No family history of colon cancer.  No changes in her bowel habits or blood in her stool.  OAB- Has had increased urinary frequency, no dysuria, no back pain, no fever.  Tried detrol but it caused dry mouth and constipation.  Feels symptoms are worsening, especially at night.  Having to wake up at night which has left her fatigued during the day.  Lab Results  Component Value Date   CHOL 161 08/19/2017   HDL 58.40 08/19/2017   LDLCALC 93 08/19/2017   TRIG 49.0 08/19/2017   CHOLHDL 3 08/19/2017   Lab Results  Component Value Date   ALT 11 08/19/2017   AST 17 08/19/2017   ALKPHOS 50 08/19/2017   BILITOT 0.4 08/19/2017   Lab Results  Component Value Date   TSH 1.15 08/19/2017    No current outpatient medications on file prior to visit.   No current facility-administered medications on file prior to visit.     Allergies  Allergen Reactions  . Macrobid [Nitrofurantoin Macrocrystal]     Past Medical History:  Diagnosis Date  . Bacterial vaginosis    recurrent    . Bell's palsy   . Fibroids 2006  . Hemangioma 2012   found on CT scan (liver)  . History of D&C 1986  . Hx of bladder infections   . Hx of migraine headaches   . IBS (irritable bowel syndrome)   . MVP (mitral valve prolapse)   . MVP (mitral valve prolapse)   . Otitis 2012    Past Surgical History:  Procedure Laterality Date  . CESAREAN SECTION  2010  . ENDOMETRIAL BIOPSY  2012  . TOOTH EXTRACTION      Family History  Problem Relation Age of Onset  . Heart disease Maternal Grandmother   . Hypertension Maternal Grandmother   . Diabetes Maternal Grandmother   . Stroke Maternal Grandmother   . Stroke Maternal Grandfather   . Cancer Maternal Grandfather        lung   . Heart disease Maternal Aunt   . Cancer Maternal Aunt        breast    Social History   Socioeconomic History  . Marital status: Married    Spouse name: Not on file  . Number of children: Not on file  . Years of education: Not on file  . Highest education level: Not on file  Occupational History  . Not on  file  Social Needs  . Financial resource strain: Not on file  . Food insecurity    Worry: Not on file    Inability: Not on file  . Transportation needs    Medical: Not on file    Non-medical: Not on file  Tobacco Use  . Smoking status: Never Smoker  . Smokeless tobacco: Never Used  Substance and Sexual Activity  . Alcohol use: Yes  . Drug use: No  . Sexual activity: Yes    Birth control/protection: None  Lifestyle  . Physical activity    Days per week: Not on file    Minutes per session: Not on file  . Stress: Not on file  Relationships  . Social Herbalist on phone: Not on file    Gets together: Not on file    Attends religious service: Not on file    Active member of club or organization: Not on file    Attends meetings of clubs or organizations: Not on file    Relationship status: Not on file  . Intimate partner violence    Fear of current or ex partner: Not on file     Emotionally abused: Not on file    Physically abused: Not on file    Forced sexual activity: Not on file  Other Topics Concern  . Not on file  Social History Narrative  . Not on file   The PMH, PSH, Social History, Family History, Medications, and allergies have been reviewed in [facility], and have been updated if relevant.  Review of Systems  Constitutional: Positive for fatigue.  HENT: Negative.   Respiratory: Negative.   Cardiovascular: Negative.   Gastrointestinal: Negative.   Endocrine: Negative.   Genitourinary: Positive for urgency. Negative for decreased urine volume, difficulty urinating, dyspareunia, dysuria, enuresis, flank pain, frequency, genital sores, hematuria, menstrual problem, pelvic pain, vaginal bleeding, vaginal discharge and vaginal pain.  Musculoskeletal: Negative.   Allergic/Immunologic: Negative.   Neurological: Negative.   Hematological: Negative.   Psychiatric/Behavioral: Negative.   All other systems reviewed and are negative.      Objective:    BP 106/68 (BP Location: Left Arm, Patient Position: Sitting, Cuff Size: Normal)   Pulse 88   Temp 98.6 F (37 C) (Oral)   Ht 5\' 5"  (1.651 m)   Wt 190 lb 3.2 oz (86.3 kg)   LMP 06/12/2019   SpO2 98%   BMI 31.65 kg/m   Wt Readings from Last 3 Encounters:  07/26/19 190 lb 3.2 oz (86.3 kg)  08/25/17 186 lb 1.9 oz (84.4 kg)  01/11/16 183 lb (83 kg)     Physical Exam   General:  Well-developed,well-nourished,in no acute distress; alert,appropriate and cooperative throughout examination Head:  normocephalic and atraumatic.   Eyes:  vision grossly intact, PERRL Ears:  R ear normal and L ear normal externally, TMs clear bilaterally Nose:  no external deformity.   Mouth:  good dentition.   Neck:  No deformities, masses, or tenderness noted. Breasts:  No mass, nodules, thickening, tenderness, bulging, retraction, inflamation, nipple discharge or skin changes noted.   Lungs:  Normal respiratory effort,  chest expands symmetrically. Lungs are clear to auscultation, no crackles or wheezes. Heart:  Normal rate and regular rhythm. S1 and S2 normal without gallop, murmur, click, rub or other extra sounds. Abdomen:  Bowel sounds positive,abdomen soft and non-tender without masses, organomegaly or hernias noted. Msk:  No deformity or scoliosis noted of thoracic or lumbar spine.   Extremities:  No clubbing, cyanosis, edema, or deformity noted with normal full range of motion of all joints.   Neurologic:  alert & oriented X3 and gait normal.   Skin:  Intact without suspicious lesions or rashes Cervical Nodes:  No lymphadenopathy noted Axillary Nodes:  No palpable lymphadenopathy Psych:  Cognition and judgment appear intact. Alert and cooperative with normal attention span and concentration. No apparent delusions, illusions, hallucinations      Assessment & Plan:   Well woman exam without gynecological exam  Need for influenza vaccination - Plan: Flu Vaccine QUAD 6+ mos PF IM (Fluarix Quad PF)  Need for Tdap vaccination - Plan: Tdap vaccine greater than or equal to 7yo IM  Well woman exam  Nocturia  OAB (overactive bladder) - Plan: Urinalysis, Routine w reflex microscopic, mirabegron ER (MYRBETRIQ) 25 MG TB24 tablet  Screening for lipoid disorders - Plan: Lipid panel  Fatigue, unspecified type - Plan: CBC with Differential/Platelet, Comprehensive metabolic panel, TSH, Vitamin D (25 hydroxy) No follow-ups on file.

## 2019-07-26 NOTE — Assessment & Plan Note (Addendum)
Reviewed preventive care protocols, scheduled due services, and updated immunizations Discussed nutrition, exercise, diet, and healthy lifestyle.  She agrees to cologuard- ordered today.  Has OBGYN appt/mammogram scheduled.  Orders Placed This Encounter  Procedures  . Flu Vaccine QUAD 6+ mos PF IM (Fluarix Quad PF)  . Tdap vaccine greater than or equal to 50yo IM  . CBC with Differential/Platelet  . Comprehensive metabolic panel  . Lipid panel  . TSH  . Urinalysis, Routine w reflex microscopic  . Vitamin D (25 hydroxy)

## 2019-08-04 LAB — COLOGUARD: Cologuard: NEGATIVE

## 2019-08-05 ENCOUNTER — Encounter: Payer: Self-pay | Admitting: Family Medicine

## 2019-08-31 ENCOUNTER — Telehealth: Payer: Self-pay | Admitting: Family Medicine

## 2019-08-31 NOTE — Telephone Encounter (Signed)
Rec'd from [place] forwarded 6 pages to Arnette Norris MD

## 2019-10-03 ENCOUNTER — Encounter: Payer: Self-pay | Admitting: Family Medicine

## 2019-10-12 ENCOUNTER — Other Ambulatory Visit: Payer: Self-pay | Admitting: Family Medicine

## 2019-10-12 DIAGNOSIS — R739 Hyperglycemia, unspecified: Secondary | ICD-10-CM

## 2019-10-12 DIAGNOSIS — I341 Nonrheumatic mitral (valve) prolapse: Secondary | ICD-10-CM

## 2019-10-13 ENCOUNTER — Other Ambulatory Visit: Payer: Self-pay

## 2019-10-14 ENCOUNTER — Other Ambulatory Visit (INDEPENDENT_AMBULATORY_CARE_PROVIDER_SITE_OTHER): Payer: BC Managed Care – PPO

## 2019-10-14 DIAGNOSIS — R739 Hyperglycemia, unspecified: Secondary | ICD-10-CM

## 2019-10-14 LAB — HEMOGLOBIN A1C: Hgb A1c MFr Bld: 5.6 % (ref 4.6–6.5)

## 2020-07-20 ENCOUNTER — Emergency Department (HOSPITAL_COMMUNITY)
Admission: EM | Admit: 2020-07-20 | Discharge: 2020-07-21 | Disposition: A | Discharge status: Home / Self Care | Payer: BC Managed Care – PPO | Attending: Emergency Medicine | Admitting: Emergency Medicine

## 2020-07-20 ENCOUNTER — Other Ambulatory Visit: Payer: Self-pay

## 2020-07-20 ENCOUNTER — Encounter (HOSPITAL_COMMUNITY): Payer: Self-pay | Admitting: Emergency Medicine

## 2020-07-20 DIAGNOSIS — S39012A Strain of muscle, fascia and tendon of lower back, initial encounter: Secondary | ICD-10-CM | POA: Diagnosis not present

## 2020-07-20 DIAGNOSIS — S0990XA Unspecified injury of head, initial encounter: Secondary | ICD-10-CM | POA: Diagnosis present

## 2020-07-20 DIAGNOSIS — Y9389 Activity, other specified: Secondary | ICD-10-CM | POA: Insufficient documentation

## 2020-07-20 DIAGNOSIS — Y92410 Unspecified street and highway as the place of occurrence of the external cause: Secondary | ICD-10-CM | POA: Insufficient documentation

## 2020-07-20 DIAGNOSIS — T148XXA Other injury of unspecified body region, initial encounter: Secondary | ICD-10-CM

## 2020-07-20 DIAGNOSIS — S0083XA Contusion of other part of head, initial encounter: Secondary | ICD-10-CM | POA: Diagnosis not present
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
## 2020-07-21 NOTE — Discharge Instructions (Signed)
You may use over-the-counter Motrin (Ibuprofen), Acetaminophen (Tylenol), topical muscle creams such as SalonPas, Icy Hot, Bengay, etc. Please stretch, apply heat, and have massage therapy for additional assistance. ° °

## 2020-07-23 ENCOUNTER — Telehealth: Payer: Self-pay
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
## 2020-07-24 ENCOUNTER — Other Ambulatory Visit: Payer: Self-pay

## 2020-07-25 ENCOUNTER — Ambulatory Visit: Payer: BC Managed Care – PPO | Admitting: Family Medicine

## 2020-07-25 ENCOUNTER — Encounter: Payer: Self-pay | Admitting: Family Medicine

## 2020-07-25 VITALS — BP 118/70 | HR 82 | Temp 97.4°F | Ht 66.0 in | Wt 189.0 lb

## 2020-07-25 DIAGNOSIS — F0781 Postconcussional syndrome: Secondary | ICD-10-CM | POA: Diagnosis not present

## 2020-07-25 DIAGNOSIS — S0083XD Contusion of other part of head, subsequent encounter: Secondary | ICD-10-CM | POA: Diagnosis not present

## 2020-07-25 DIAGNOSIS — M542 Cervicalgia: Secondary | ICD-10-CM

## 2020-07-25 MED ORDER — CYCLOBENZAPRINE HCL 5 MG PO TABS: 5.0000 mg | ORAL_TABLET | Freq: Every day | ORAL | 0 refills | Status: DC
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
## 2020-07-25 NOTE — Patient Instructions (Addendum)
Heating pad 2x /day 15-20min on then off Stretching exercises at least daily Take flexeril 5mg  at bedtime - may make you sleepy   Drink plenty of water Rest, limit screen time   Cervical Strain and Sprain Rehab Ask your health care provider which exercises are safe for you. Do exercises exactly as told by your health care provider and adjust them as directed. It is normal to feel mild stretching, pulling, tightness, or discomfort as you do these exercises. Stop right away if you feel sudden pain or your pain gets worse. Do not begin these exercises until told by your health care provider. Stretching and range-of-motion exercises Cervical side bending  1. Using good posture, sit on a stable chair or stand up. 2. Without moving your shoulders, slowly tilt your left / right ear to your shoulder until you feel a stretch in the opposite side neck muscles. You should be looking straight ahead. 3. Hold for __________ seconds. 4. Repeat with the other side of your neck. Repeat __________ times. Complete this exercise __________ times a day. Cervical rotation  1. Using good posture, sit on a stable chair or stand up. 2. Slowly turn your head to the side as if you are looking over your left / right shoulder. ? Keep your eyes level with the ground. ? Stop when you feel a stretch along the side and the back of your neck. 3. Hold for __________ seconds. 4. Repeat this by turning to your other side. Repeat __________ times. Complete this exercise __________ times a day. Thoracic extension and pectoral stretch 1. Roll a towel or a small blanket so it is about 4 inches (10 cm) in diameter. 2. Lie down on your back on a firm surface. 3. Put the towel lengthwise, under your spine in the middle of your back. It should not be under your shoulder blades. The towel should line up with your spine from your middle back to your lower back. 4. Put your hands behind your head and let your elbows fall out to  your sides. 5. Hold for __________ seconds. Repeat __________ times. Complete this exercise __________ times a day. Strengthening exercises Isometric upper cervical flexion 1. Lie on your back with a thin pillow behind your head and a small rolled-up towel under your neck. 2. Gently tuck your chin toward your chest and nod your head down to look toward your feet. Do not lift your head off the pillow. 3. Hold for __________ seconds. 4. Release the tension slowly. Relax your neck muscles completely before you repeat this exercise. Repeat __________ times. Complete this exercise __________ times a day. Isometric cervical extension  1. Stand about 6 inches (15 cm) away from a wall, with your back facing the wall. 2. Place a soft object, about 6-8 inches (15-20 cm) in diameter, between the back of your head and the wall. A soft object could be a small pillow, a ball, or a folded towel. 3. Gently tilt your head back and press into the soft object. Keep your jaw and forehead relaxed. 4. Hold for __________ seconds. 5. Release the tension slowly. Relax your neck muscles completely before you repeat this exercise. Repeat __________ times. Complete this exercise __________ times a day. Posture and body mechanics Body mechanics refers to the movements and positions of your body while you do your daily activities. Posture is part of body mechanics. Good posture and healthy body mechanics can help to relieve stress in your body's tissues and joints. Good posture  means that your spine is in its natural S-curve position (your spine is neutral), your shoulders are pulled back slightly, and your head is not tipped forward. The following are general guidelines for applying improved posture and body mechanics to your everyday activities. Sitting  1. When sitting, keep your spine neutral and keep your feet flat on the floor. Use a footrest, if necessary, and keep your thighs parallel to the floor. Avoid rounding  your shoulders, and avoid tilting your head forward. 2. When working at a desk or a computer, keep your desk at a height where your hands are slightly lower than your elbows. Slide your chair under your desk so you are close enough to maintain good posture. 3. When working at a computer, place your monitor at a height where you are looking straight ahead and you do not have to tilt your head forward or downward to look at the screen. Standing   When standing, keep your spine neutral and keep your feet about hip-width apart. Keep a slight bend in your knees. Your ears, shoulders, and hips should line up.  When you do a task in which you stand in one place for a long time, place one foot up on a stable object that is 2-4 inches (5-10 cm) high, such as a footstool. This helps keep your spine neutral. Resting When lying down and resting, avoid positions that are most painful for you. Try to support your neck in a neutral position. You can use a contour pillow or a small rolled-up towel. Your pillow should support your neck but not push on it. This information is not intended to replace advice given to you by your health care provider. Make sure you discuss any questions you have with your health care provider. Document Revised: 02/02/2019 Document Reviewed: 07/14/2018 Elsevier Patient Education  [place].

## 2020-07-30 ENCOUNTER — Encounter: Payer: Self-pay | Admitting: Family Medicine

## 2020-08-01 ENCOUNTER — Other Ambulatory Visit: Payer: Self-pay

## 2020-08-01 ENCOUNTER — Ambulatory Visit
Admission: RE | Admit: 2020-08-01 | Discharge: 2020-08-01 | Disposition: A | Discharge status: Home / Self Care | Payer: BC Managed Care – PPO | Source: Ambulatory Visit | Attending: Family Medicine | Admitting: Family Medicine

## 2020-08-01 DIAGNOSIS — M542 Cervicalgia: Secondary | ICD-10-CM | POA: Diagnosis not present

## 2020-08-01 DIAGNOSIS — S0990XA Unspecified injury of head, initial encounter: Secondary | ICD-10-CM | POA: Diagnosis not present

## 2020-08-01 DIAGNOSIS — S0083XD Contusion of other part of head, subsequent encounter: Secondary | ICD-10-CM

## 2020-08-01 DIAGNOSIS — F0781 Postconcussional syndrome: Secondary | ICD-10-CM | POA: Insufficient documentation

## 2020-08-01 DIAGNOSIS — Y92410 Unspecified street and highway as the place of occurrence of the external cause: Secondary | ICD-10-CM | POA: Diagnosis not present

## 2020-08-02 ENCOUNTER — Encounter: Payer: Self-pay | Admitting: Family Medicine

## 2020-08-09 ENCOUNTER — Other Ambulatory Visit: Payer: BC Managed Care – PPO

## 2020-08-24 ENCOUNTER — Encounter: Payer: BC Managed Care – PPO | Admitting: Family Medicine

## 2020-11-13 NOTE — Progress Notes (Signed)
Pt cancelled d/t weather

## 2020-11-14 ENCOUNTER — Ambulatory Visit: Payer: Self-pay | Admitting: Family Medicine

## 2020-11-14 DIAGNOSIS — Z13220 Encounter for screening for lipoid disorders: Secondary | ICD-10-CM

## 2020-11-14 DIAGNOSIS — Z1321 Encounter for screening for nutritional disorder: Secondary | ICD-10-CM

## 2020-11-14 DIAGNOSIS — Z0000 Encounter for general adult medical examination without abnormal findings: Secondary | ICD-10-CM

## 2020-11-14 DIAGNOSIS — R739 Hyperglycemia, unspecified: Secondary | ICD-10-CM

## 2020-11-14 NOTE — Patient Instructions (Signed)
Health Maintenance, Female Adopting a healthy lifestyle and getting preventive care are important in promoting health and wellness. Ask your health care provider about:  The right schedule for you to have regular tests and exams.  Things you can do on your own to prevent diseases and keep yourself healthy. What should I know about diet, weight, and exercise? Eat a healthy diet  Eat a diet that includes plenty of vegetables, fruits, low-fat dairy products, and lean protein.  Do not eat a lot of foods that are high in solid fats, added sugars, or sodium.   Maintain a healthy weight Body mass index (BMI) is used to identify weight problems. It estimates body fat based on height and weight. Your health care provider can help determine your BMI and help you achieve or maintain a healthy weight. Get regular exercise Get regular exercise. This is one of the most important things you can do for your health. Most adults should:  Exercise for at least 150 minutes each week. The exercise should increase your heart rate and make you sweat (moderate-intensity exercise).  Do strengthening exercises at least twice a week. This is in addition to the moderate-intensity exercise.  Spend less time sitting. Even light physical activity can be beneficial. Watch cholesterol and blood lipids Have your blood tested for lipids and cholesterol at 52 years of age, then have this test every 5 years. Have your cholesterol levels checked more often if:  Your lipid or cholesterol levels are high.  You are older than 52 years of age.  You are at high risk for heart disease. What should I know about cancer screening? Depending on your health history and family history, you may need to have cancer screening at various ages. This may include screening for:  Breast cancer.  Cervical cancer.  Colorectal cancer.  Skin cancer.  Lung cancer. What should I know about heart disease, diabetes, and high blood  pressure? Blood pressure and heart disease  High blood pressure causes heart disease and increases the risk of stroke. This is more likely to develop in people who have high blood pressure readings, are of African descent, or are overweight.  Have your blood pressure checked: ? Every 3-5 years if you are 18-39 years of age. ? Every year if you are 40 years old or older. Diabetes Have regular diabetes screenings. This checks your fasting blood sugar level. Have the screening done:  Once every three years after age 40 if you are at a normal weight and have a low risk for diabetes.  More often and at a younger age if you are overweight or have a high risk for diabetes. What should I know about preventing infection? Hepatitis B If you have a higher risk for hepatitis B, you should be screened for this virus. Talk with your health care provider to find out if you are at risk for hepatitis B infection. Hepatitis C Testing is recommended for:  Everyone born from 1945 through 1965.  Anyone with known risk factors for hepatitis C. Sexually transmitted infections (STIs)  Get screened for STIs, including gonorrhea and chlamydia, if: ? You are sexually active and are younger than 52 years of age. ? You are older than 52 years of age and your health care provider tells you that you are at risk for this type of infection. ? Your sexual activity has changed since you were last screened, and you are at increased risk for chlamydia or gonorrhea. Ask your health care provider   if you are at risk.  Ask your health care provider about whether you are at high risk for HIV. Your health care provider may recommend a prescription medicine to help prevent HIV infection. If you choose to take medicine to prevent HIV, you should first get tested for HIV. You should then be tested every 3 months for as long as you are taking the medicine. Pregnancy  If you are about to stop having your period (premenopausal) and  you may become pregnant, seek counseling before you get pregnant.  Take 400 to 800 micrograms (mcg) of folic acid every day if you become pregnant.  Ask for birth control (contraception) if you want to prevent pregnancy. Osteoporosis and menopause Osteoporosis is a disease in which the bones lose minerals and strength with aging. This can result in bone fractures. If you are 65 years old or older, or if you are at risk for osteoporosis and fractures, ask your health care provider if you should:  Be screened for bone loss.  Take a calcium or vitamin D supplement to lower your risk of fractures.  Be given hormone replacement therapy (HRT) to treat symptoms of menopause. Follow these instructions at home: Lifestyle  Do not use any products that contain nicotine or tobacco, such as cigarettes, e-cigarettes, and chewing tobacco. If you need help quitting, ask your health care provider.  Do not use street drugs.  Do not share needles.  Ask your health care provider for help if you need support or information about quitting drugs. Alcohol use  Do not drink alcohol if: ? Your health care provider tells you not to drink. ? You are pregnant, may be pregnant, or are planning to become pregnant.  If you drink alcohol: ? Limit how much you use to 0-1 drink a day. ? Limit intake if you are breastfeeding.  Be aware of how much alcohol is in your drink. In the [place] one drink equals one 12 oz bottle of beer (355 mL), one 5 oz glass of wine (148 mL), or one 1 oz glass of hard liquor (44 mL). General instructions  Schedule regular health, dental, and eye exams.  Stay current with your vaccines.  Tell your health care provider if: ? You often feel depressed. ? You have ever been abused or do not feel safe at home. Summary  Adopting a healthy lifestyle and getting preventive care are important in promoting health and wellness.  Follow your health care provider's instructions about healthy  diet, exercising, and getting tested or screened for diseases.  Follow your health care provider's instructions on monitoring your cholesterol and blood pressure. This information is not intended to replace advice given to you by your health care provider. Make sure you discuss any questions you have with your health care provider. Document Revised: 10/06/2018 Document Reviewed: 10/06/2018 Elsevier Patient Education  2021 [employer]  

## 2020-11-23 ENCOUNTER — Encounter: Payer: Self-pay | Admitting: Family Medicine

## 2020-11-23 DIAGNOSIS — R739 Hyperglycemia, unspecified: Secondary | ICD-10-CM

## 2020-11-23 DIAGNOSIS — Z0000 Encounter for general adult medical examination without abnormal findings: Secondary | ICD-10-CM

## 2020-11-23 DIAGNOSIS — Z13220 Encounter for screening for lipoid disorders: Secondary | ICD-10-CM

## 2020-11-23 DIAGNOSIS — Z1321 Encounter for screening for nutritional disorder: Secondary | ICD-10-CM

## 2020-11-29 ENCOUNTER — Other Ambulatory Visit: Payer: Self-pay

## 2020-11-30 ENCOUNTER — Other Ambulatory Visit (INDEPENDENT_AMBULATORY_CARE_PROVIDER_SITE_OTHER): Payer: BC Managed Care – PPO

## 2020-11-30 DIAGNOSIS — R739 Hyperglycemia, unspecified: Secondary | ICD-10-CM | POA: Diagnosis not present

## 2020-11-30 DIAGNOSIS — Z1321 Encounter for screening for nutritional disorder: Secondary | ICD-10-CM | POA: Diagnosis not present

## 2020-11-30 DIAGNOSIS — Z0000 Encounter for general adult medical examination without abnormal findings: Secondary | ICD-10-CM | POA: Diagnosis not present

## 2020-11-30 DIAGNOSIS — Z13220 Encounter for screening for lipoid disorders: Secondary | ICD-10-CM | POA: Diagnosis not present

## 2020-11-30 LAB — LIPID PANEL
Cholesterol: 193 mg/dL (ref 0–200)
HDL: 67.3 mg/dL
LDL Cholesterol: 118 mg/dL — ABNORMAL HIGH (ref 0–99)
NonHDL: 126
Total CHOL/HDL Ratio: 3
Triglycerides: 40 mg/dL (ref 0.0–149.0)
VLDL: 8 mg/dL (ref 0.0–40.0)

## 2020-11-30 LAB — BASIC METABOLIC PANEL WITH GFR
BUN: 18 mg/dL (ref 6–23)
CO2: 33 meq/L — ABNORMAL HIGH (ref 19–32)
Calcium: 9.8 mg/dL (ref 8.4–10.5)
Chloride: 105 meq/L (ref 96–112)
Creatinine, Ser: 0.98 mg/dL (ref 0.40–1.20)
GFR: 66.84 mL/min
Glucose, Bld: 97 mg/dL (ref 70–99)
Potassium: 5.2 meq/L — ABNORMAL HIGH (ref 3.5–5.1)
Sodium: 142 meq/L (ref 135–145)

## 2020-11-30 LAB — CBC
HCT: 39.1 % (ref 36.0–46.0)
Hemoglobin: 13.2 g/dL (ref 12.0–15.0)
MCHC: 33.7 g/dL (ref 30.0–36.0)
MCV: 87.5 fl (ref 78.0–100.0)
Platelets: 345 10*3/uL (ref 150.0–400.0)
RBC: 4.47 Mil/uL (ref 3.87–5.11)
RDW: 15.3 % (ref 11.5–15.5)
WBC: 4.2 10*3/uL (ref 4.0–10.5)

## 2020-11-30 LAB — VITAMIN D 25 HYDROXY (VIT D DEFICIENCY, FRACTURES): VITD: 59.87 ng/mL (ref 30.00–100.00)

## 2020-11-30 LAB — HEMOGLOBIN A1C: Hgb A1c MFr Bld: 5.6 % (ref 4.6–6.5)

## 2020-11-30 LAB — AST: AST: 17 U/L (ref 0–37)

## 2020-11-30 LAB — VITAMIN B12: Vitamin B-12: 444 pg/mL (ref 211–911)

## 2020-11-30 LAB — ALT: ALT: 12 U/L (ref 0–35)

## 2020-12-05 ENCOUNTER — Encounter: Payer: Self-pay | Admitting: Family Medicine

## 2020-12-06 ENCOUNTER — Encounter: Payer: Self-pay | Admitting: Family Medicine

## 2020-12-06 ENCOUNTER — Other Ambulatory Visit: Payer: Self-pay

## 2020-12-06 ENCOUNTER — Ambulatory Visit (INDEPENDENT_AMBULATORY_CARE_PROVIDER_SITE_OTHER): Payer: BC Managed Care – PPO | Admitting: Family Medicine

## 2020-12-06 VITALS — BP 124/76 | HR 89 | Temp 98.2°F | Ht 65.5 in | Wt 197.4 lb

## 2020-12-06 DIAGNOSIS — Z0000 Encounter for general adult medical examination without abnormal findings: Secondary | ICD-10-CM | POA: Diagnosis not present

## 2020-12-06 DIAGNOSIS — Z23 Encounter for immunization: Secondary | ICD-10-CM | POA: Diagnosis not present

## 2020-12-06 DIAGNOSIS — Z2821 Immunization not carried out because of patient refusal: Secondary | ICD-10-CM

## 2020-12-06 NOTE — Patient Instructions (Addendum)
Shingles vaccine (shingrix)  Heart-Healthy Eating Plan Many factors influence your heart (coronary) health, including eating and exercise habits. Coronary risk increases with abnormal blood fat (lipid) levels. Heart-healthy meal planning includes limiting unhealthy fats, increasing healthy fats, and making other diet and lifestyle changes. What is my plan? Your health care provider may recommend that you:  Limit your fat intake to _________% or less of your total calories each day.  Limit your saturated fat intake to _________% or less of your total calories each day.  Limit the amount of cholesterol in your diet to less than _________ mg per day. What are tips for following this plan? Cooking Cook foods using methods other than frying. Baking, boiling, grilling, and broiling are all good options. Other ways to reduce fat include:  Removing the skin from poultry.  Removing all visible fats from meats.  Steaming vegetables in water or broth. Meal planning  At meals, imagine dividing your plate into fourths: ? Fill one-half of your plate with vegetables and green salads. ? Fill one-fourth of your plate with whole grains. ? Fill one-fourth of your plate with lean protein foods.  Eat 4-5 servings of vegetables per day. One serving equals 1 cup raw or cooked vegetable, or 2 cups raw leafy greens.  Eat 4-5 servings of fruit per day. One serving equals 1 medium whole fruit,  cup dried fruit,  cup fresh, frozen, or canned fruit, or  cup 100% fruit juice.  Eat more foods that contain soluble fiber. Examples include apples, broccoli, carrots, beans, peas, and barley. Aim to get 25-30 g of fiber per day.  Increase your consumption of legumes, nuts, and seeds to 4-5 servings per week. One serving of dried beans or legumes equals  cup cooked, 1 serving of nuts is  cup, and 1 serving of seeds equals 1 tablespoon.   Fats  Choose healthy fats more often. Choose monounsaturated and  polyunsaturated fats, such as olive and canola oils, flaxseeds, walnuts, almonds, and seeds.  Eat more omega-3 fats. Choose salmon, mackerel, sardines, tuna, flaxseed oil, and ground flaxseeds. Aim to eat fish at least 2 times each week.  Check food labels carefully to identify foods with trans fats or high amounts of saturated fat.  Limit saturated fats. These are found in animal products, such as meats, butter, and cream. Plant sources of saturated fats include palm oil, palm kernel oil, and coconut oil.  Avoid foods with partially hydrogenated oils in them. These contain trans fats. Examples are stick margarine, some tub margarines, cookies, crackers, and other baked goods.  Avoid fried foods. General information  Eat more home-cooked food and less restaurant, buffet, and fast food.  Limit or avoid alcohol.  Limit foods that are high in starch and sugar.  Lose weight if you are overweight. Losing just 5-10% of your body weight can help your overall health and prevent diseases such as diabetes and heart disease.  Monitor your salt (sodium) intake, especially if you have high blood pressure. Talk with your health care provider about your sodium intake.  Try to incorporate more vegetarian meals weekly. What foods can I eat? Fruits All fresh, canned (in natural juice), or frozen fruits. Vegetables Fresh or frozen vegetables (raw, steamed, roasted, or grilled). Green salads. Grains Most grains. Choose whole wheat and whole grains most of the time. Rice and pasta, including brown rice and pastas made with whole wheat. Meats and other proteins Lean, well-trimmed beef, veal, pork, and lamb. Chicken and [place] without skin.  All fish and shellfish. Wild duck, rabbit, pheasant, and venison. Egg whites or low-cholesterol egg substitutes. Dried beans, peas, lentils, and tofu. Seeds and most nuts. Dairy Low-fat or nonfat cheeses, including ricotta and mozzarella. Skim or 1% milk (liquid,  powdered, or evaporated). Buttermilk made with low-fat milk. Nonfat or low-fat yogurt. Fats and oils Non-hydrogenated (trans-free) margarines. Vegetable oils, including soybean, sesame, sunflower, olive, peanut, safflower, corn, canola, and cottonseed. Salad dressings or mayonnaise made with a vegetable oil. Beverages Water (mineral or sparkling). Coffee and tea. Diet carbonated beverages. Sweets and desserts Sherbet, gelatin, and fruit ice. Small amounts of dark chocolate. Limit all sweets and desserts. Seasonings and condiments All seasonings and condiments. The items listed above may not be a complete list of foods and beverages you can eat. Contact a dietitian for more options. What foods are not recommended? Fruits Canned fruit in heavy syrup. Fruit in cream or butter sauce. Fried fruit. Limit coconut. Vegetables Vegetables cooked in cheese, cream, or butter sauce. Fried vegetables. Grains Breads made with saturated or trans fats, oils, or whole milk. Croissants. Sweet rolls. Donuts. High-fat crackers, such as cheese crackers. Meats and other proteins Fatty meats, such as hot dogs, ribs, sausage, bacon, rib-eye roast or steak. High-fat deli meats, such as salami and bologna. Caviar. Domestic duck and goose. Organ meats, such as liver. Dairy Cream, sour cream, cream cheese, and creamed cottage cheese. Whole milk cheeses. Whole or 2% milk (liquid, evaporated, or condensed). Whole buttermilk. Cream sauce or high-fat cheese sauce. Whole-milk yogurt. Fats and oils Meat fat, or shortening. Cocoa butter, hydrogenated oils, palm oil, coconut oil, palm kernel oil. Solid fats and shortenings, including bacon fat, salt pork, lard, and butter. Nondairy cream substitutes. Salad dressings with cheese or sour cream. Beverages Regular sodas and any drinks with added sugar. Sweets and desserts Frosting. Pudding. Cookies. Cakes. Pies. Milk chocolate or white chocolate. Buttered syrups. Full-fat ice  cream or ice cream drinks. The items listed above may not be a complete list of foods and beverages to avoid. Contact a dietitian for more information. Summary  Heart-healthy meal planning includes limiting unhealthy fats, increasing healthy fats, and making other diet and lifestyle changes.  Lose weight if you are overweight. Losing just 5-10% of your body weight can help your overall health and prevent diseases such as diabetes and heart disease.  Focus on eating a balance of foods, including fruits and vegetables, low-fat or nonfat dairy, lean protein, nuts and legumes, whole grains, and heart-healthy oils and fats. This information is not intended to replace advice given to you by your health care provider. Make sure you discuss any questions you have with your health care provider. Document Revised: 11/20/2017 Document Reviewed: 11/20/2017 Elsevier Patient Education  2021 [place].   Zoster Vaccine, Recombinant injection What is this medicine? ZOSTER VACCINE (ZOS ter vak SEEN) is a vaccine used to reduce the risk of getting shingles. This vaccine is not used to treat shingles or nerve pain from shingles. This medicine may be used for other purposes; ask your health care provider or pharmacist if you have questions. COMMON BRAND NAME(S): [facility] What should I tell my health care provider before I take this medicine? They need to know if you have any of these conditions:  cancer  immune system problems  an unusual or allergic reaction to Zoster vaccine, other medications, foods, dyes, or preservatives  pregnant or trying to get pregnant  breast-feeding How should I use this medicine? This vaccine is injected into a  muscle. It is given by a health care provider. A copy of Vaccine Information Statements will be given before each vaccination. Be sure to read this information carefully each time. This sheet may change often. Talk to your health care provider about the use of  this vaccine in children. This vaccine is not approved for use in children. Overdosage: If you think you have taken too much of this medicine contact a poison control center or emergency room at once. NOTE: This medicine is only for you. Do not share this medicine with others. What if I miss a dose? Keep appointments for follow-up (booster) doses. It is important not to miss your dose. Call your health care provider if you are unable to keep an appointment. What may interact with this medicine?  medicines that suppress your immune system  medicines to treat cancer  steroid medicines like prednisone or cortisone This list may not describe all possible interactions. Give your health care provider a list of all the medicines, herbs, non-prescription drugs, or dietary supplements you use. Also tell them if you smoke, drink alcohol, or use illegal drugs. Some items may interact with your medicine. What should I watch for while using this medicine? Visit your health care provider regularly. This vaccine, like all vaccines, may not fully protect everyone. What side effects may I notice from receiving this medicine? Side effects that you should report to your doctor or health care professional as soon as possible:  allergic reactions (skin rash, itching or hives; swelling of the face, lips, or tongue)  trouble breathing Side effects that usually do not require medical attention (report these to your doctor or health care professional if they continue or are bothersome):  chills  headache  fever  nausea  pain, redness, or irritation at site where injected  tiredness  vomiting This list may not describe all possible side effects. Call your doctor for medical advice about side effects. You may report side effects to FDA at 1-800-FDA-1088. Where should I keep my medicine? This vaccine is only given by a health care provider. It will not be stored at home. NOTE: This sheet is a summary. It  may not cover all possible information. If you have questions about this medicine, talk to your doctor, pharmacist, or health care provider.  2021 Elsevier/Gold Standard (2019-11-18 16:23:07)

## 2020-12-06 NOTE — Progress Notes (Signed)
Margaret Durham is a 50 y.o. female  Chief Complaint  Patient presents with   [place].  C/o having hot flashes, urine frequency, gained weight, not sleeping well, and has migraines off/on.  Declines flu shot.     HPI: Margaret Durham is a 50 y.o. female seen today for annual CPE. She had labs done recently ahead of this appt.   Last PAP: due - has appt in 12/2020 with [place]: due and will have done at appt in 12/2020 Last colonoscopy: cologuard negative in 07/2019 - due in 07/2022 and is agreeable to colonoscopy at that time  Dental: UTD  Vision: wears glasses and UTD on exam  Med refills needed today? n/a  She is taking 5000IU Vit D daily, B12 183mcg daily   Past Medical History:  Diagnosis Date   Bacterial vaginosis    recurrent    Bell's palsy    Fibroids 2006   Hemangioma 2012   found on CT scan (liver)   History of D&C 1986   Hx of bladder infections    Hx of migraine headaches    IBS (irritable bowel syndrome)    MVP (mitral valve prolapse)    MVP (mitral valve prolapse)    Otitis 2012    Past Surgical History:  Procedure Laterality Date   CESAREAN SECTION  2010   ENDOMETRIAL BIOPSY  2012   TOOTH EXTRACTION      Social History   Socioeconomic History   Marital status: Married    Spouse name: Not on file   Number of children: Not on file   Years of education: Not on file   Highest education level: Not on file  Occupational History   Not on file  Tobacco Use   Smoking status: Never Smoker   Smokeless tobacco: Never Used  Vaping Use   Vaping Use: Never used  Substance and Sexual Activity   Alcohol use: Yes   Drug use: No   Sexual activity: Yes    Birth control/protection: None  Other Topics Concern   Not on file  Social History Narrative   Not on file   Social Determinants of Health   Financial Resource Strain: Not on file  Food Insecurity: Not on file   Transportation Needs: Not on file  Physical Activity: Not on file  Stress: Not on file  Social Connections: Not on file  Intimate Partner Violence: Not on file    Family History  Problem Relation Age of Onset   Heart disease Maternal Grandmother    Hypertension Maternal Grandmother    Diabetes Maternal Grandmother    Stroke Maternal Grandmother    Stroke Maternal Grandfather    Cancer Maternal Grandfather        lung    Heart disease Maternal Aunt    Cancer Maternal Aunt        breast     Immunization History  Administered Date(s) Administered   Influenza,inj,Quad PF,6+ Mos 09/10/2015, 09/16/2018, 07/26/2019   Moderna Sars-Covid-2 Vaccination 05/20/2020, 06/17/2020   Tdap 07/26/2019    Outpatient Encounter Medications as of 12/06/2020  Medication Sig   Chelated Magnesium 100 MG TABS Take 100 mg by mouth daily.   cholecalciferol (VITAMIN D3) 25 MCG (1000 UNIT) tablet Take 1,000 Units by mouth daily.   vitamin B-12 (CYANOCOBALAMIN) 1000 MCG tablet Take 1,000 mcg by mouth daily.   zinc gluconate 50 MG tablet Take 50 mg by mouth daily.   [  DISCONTINUED] cyclobenzaprine (FLEXERIL) 5 MG tablet Take 1 tablet (5 mg total) by mouth at bedtime. (Patient not taking: Reported on 12/06/2020)   [DISCONTINUED] omeprazole (PRILOSEC OTC) 20 MG tablet Take 20 mg by mouth daily. (Patient not taking: Reported on 12/06/2020)   No facility-administered encounter medications on file as of 12/06/2020.     ROS: Pertinent positives and negatives noted in HPI. Remainder of ROS non-contributory + weight gain, fatigue + hot flashes + irregular periods   Allergies  Allergen Reactions   Macrobid [Nitrofurantoin Macrocrystal] Itching    BP 124/76    Pulse 89    Temp 98.2 F (36.8 C) (Temporal)    Ht 5' 5.5" (1.664 m)    Wt 197 lb 6.4 oz (89.5 kg)    SpO2 99%    BMI 32.35 kg/m    Wt Readings from Last 3 Encounters:  12/06/20 197 lb 6.4 oz (89.5 kg)  07/25/20 189 lb (85.7 kg)   07/20/20 184 lb 15.5 oz (83.9 kg)   Temp Readings from Last 3 Encounters:  12/06/20 98.2 F (36.8 C) (Temporal)  07/25/20 (!) 97.4 F (36.3 C) (Temporal)  07/21/20 98.3 F (36.8 C) (Oral)   BP Readings from Last 3 Encounters:  12/06/20 124/76  07/25/20 118/70  07/21/20 108/82   Pulse Readings from Last 3 Encounters:  12/06/20 89  07/25/20 82  07/21/20 (!) 55    Physical Exam Constitutional:      General: She is not in acute distress.    Appearance: She is well-developed and well-nourished.  HENT:     Head: Normocephalic and atraumatic.     Right Ear: Tympanic membrane and ear canal normal.     Left Ear: Tympanic membrane and ear canal normal.     Nose: Nose normal.     Mouth/Throat:     Mouth: Oropharynx is clear and moist and mucous membranes are normal.  Eyes:     Conjunctiva/sclera: Conjunctivae normal.     Pupils: Pupils are equal, round, and reactive to light.  Neck:     Thyroid: No thyromegaly.  Cardiovascular:     Rate and Rhythm: Normal rate and regular rhythm.     Pulses: Intact distal pulses.     Heart sounds: Normal heart sounds. No murmur heard.   Pulmonary:     Effort: Pulmonary effort is normal. No respiratory distress.     Breath sounds: Normal breath sounds. No wheezing or rhonchi.  Abdominal:     General: Bowel sounds are normal. There is no distension.     Palpations: Abdomen is soft. There is no mass.     Tenderness: There is no abdominal tenderness.  Musculoskeletal:        General: No edema.     Cervical back: Neck supple.     Right lower leg: No edema.     Left lower leg: No edema.  Lymphadenopathy:     Cervical: No cervical adenopathy.  Skin:    General: Skin is warm and dry.  Neurological:     Mental Status: She is alert and oriented to person, place, and time.     Motor: No abnormal muscle tone.     Coordination: Coordination normal.  Psychiatric:        Mood and Affect: Mood and affect normal.        Behavior: Behavior  normal.      A/P:  1. Need for shingles vaccine - Varicella-zoster vaccine IM (Shingrix) - RTO for RN visit in 2-42mo for #  2  2. Annual physical exam - discussed importance of regular CV exercise, healthy diet, adequate sleep - labs done previously and reviewed thoroughly in office today with pt - PAP and mammo appt scheduled in 12/2020, will discuss perimenopausal symptoms with GYN at [facility] - due for CRC screening in 2023 - cologuard done previously but pt is agreeable to colonoscopy next year - dental and vision UTD - next CPE in 1 year  3. Influenza vaccination declined by patient    This visit occurred during the SARS-CoV-2 public health emergency.  Safety protocols were in place, including screening questions prior to the visit, additional usage of staff PPE, and extensive cleaning of exam room while observing appropriate contact time as indicated for disinfecting solutions.

## 2020-12-07 ENCOUNTER — Encounter: Payer: Self-pay | Admitting: Family Medicine

## 2021-08-05 IMAGING — CT CT HEAD W/O CM
1 series · 16 of 29 positions shown, 20 images · non-contrast
Comparison: CT brain 04/30/2011

CLINICAL DATA: Head trauma headache

EXAM:
CT HEAD WITHOUT CONTRAST
TECHNIQUE: Contiguous axial images were obtained from the base of the skull
through the vertex without intravenous contrast.

[Series 2: head wo · axial · 0.43mm/px · z∈[-190,-60]mm · 16 of 29 slices shown, 20 images]
[im 2/29  brain]
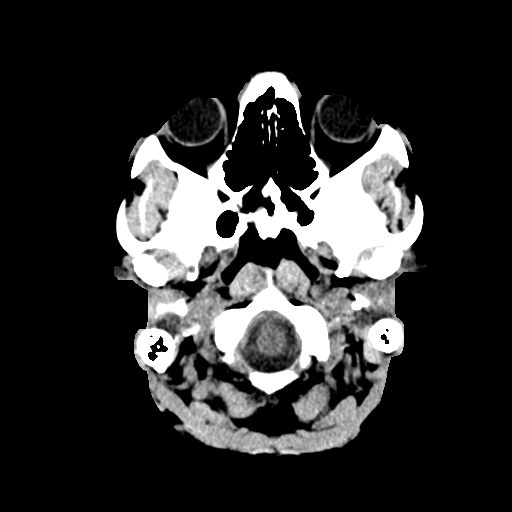
[im 2/29  bone]
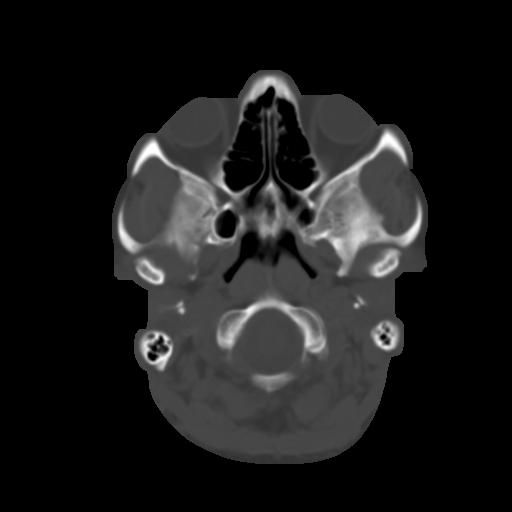
[im 4/29  brain]
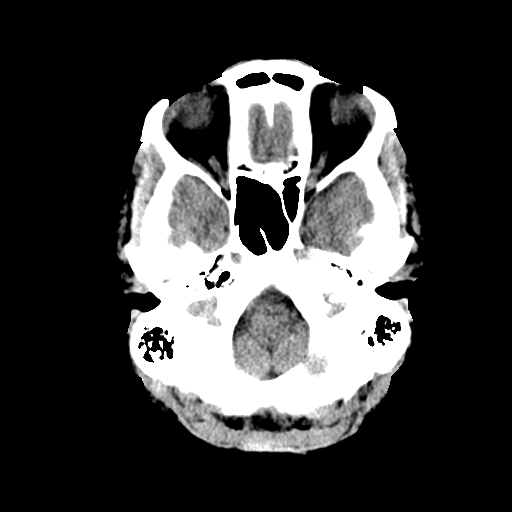
[im 6/29  brain]
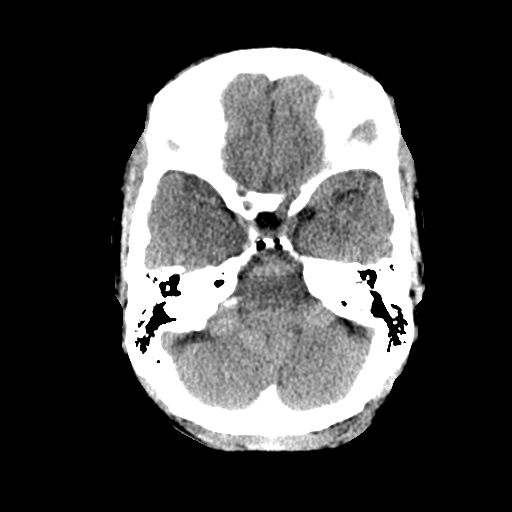
[im 7/29  brain]
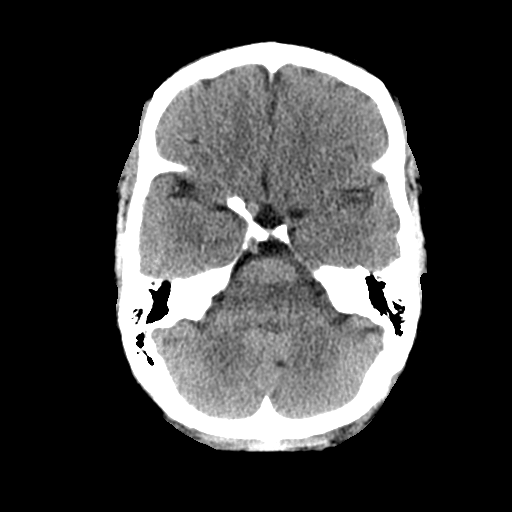
[im 9/29  brain]
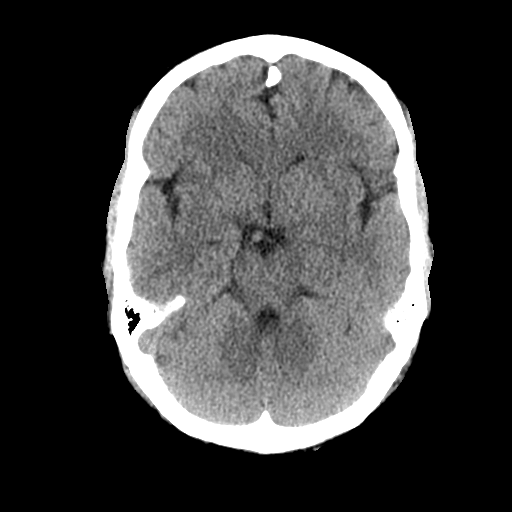
[im 9/29  bone]
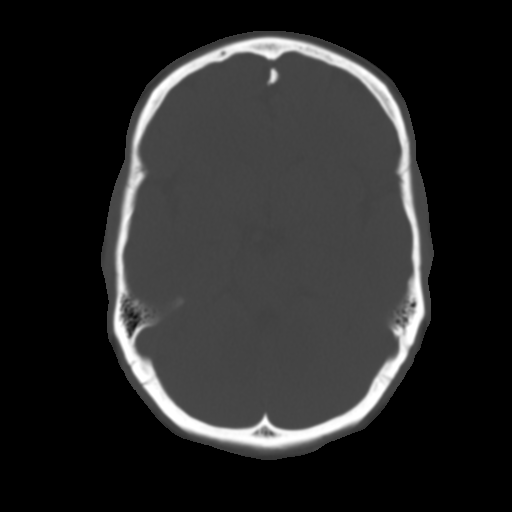
[im 11/29  brain]
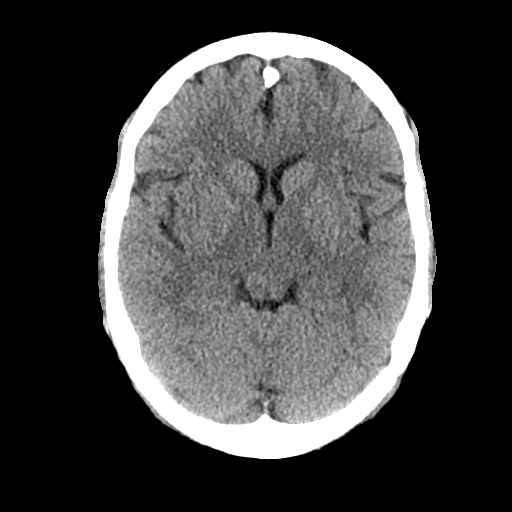
[im 12/29  brain]
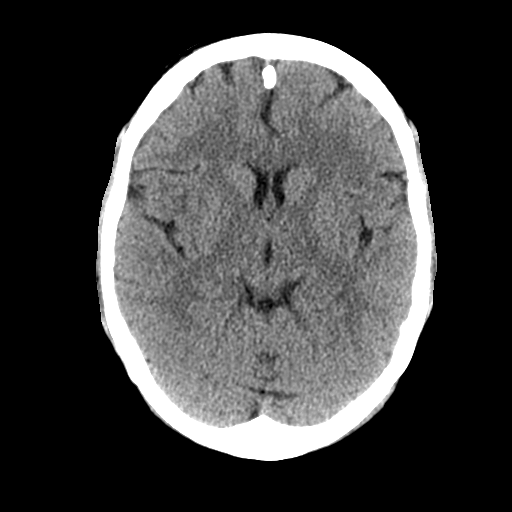
[im 14/29  brain]
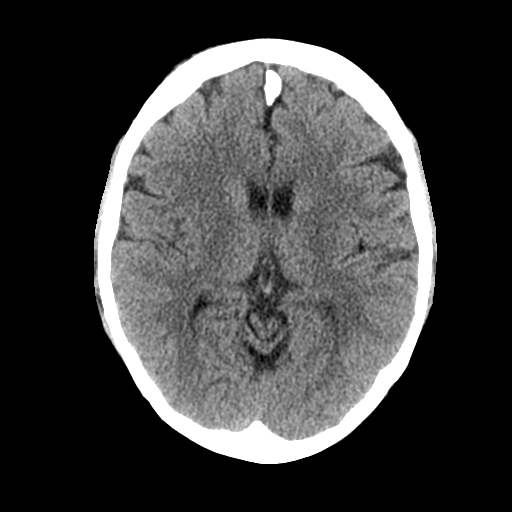
[im 16/29  brain]
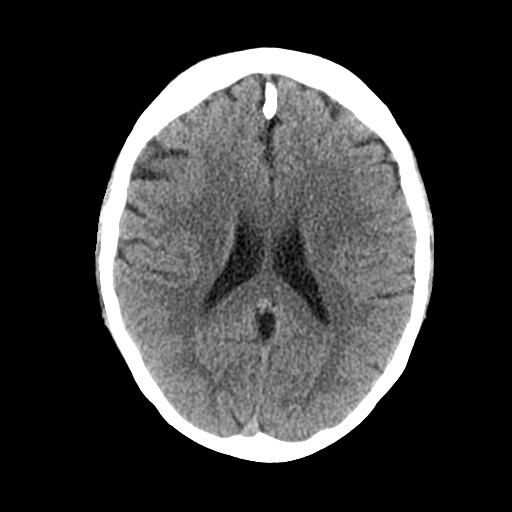
[im 16/29  bone]
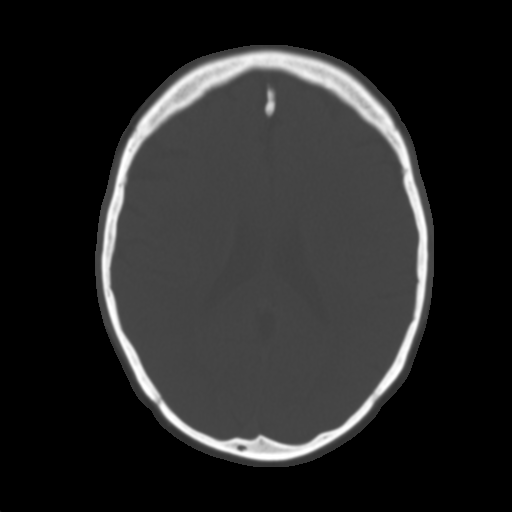
[im 18/29  brain]
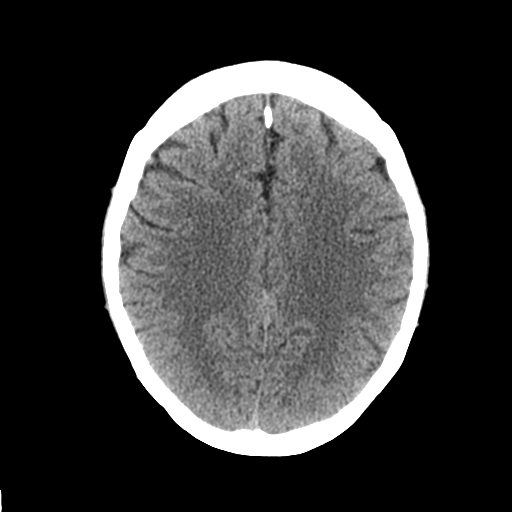
[im 19/29  brain]
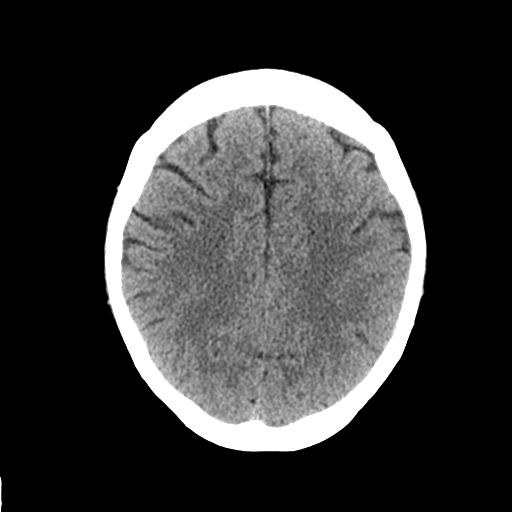
[im 21/29  brain]
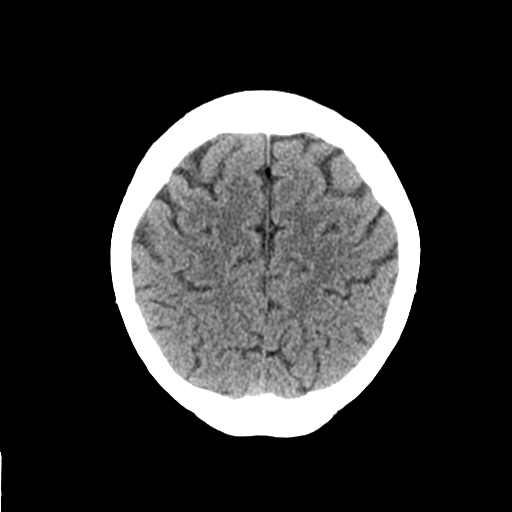
[im 23/29  brain]
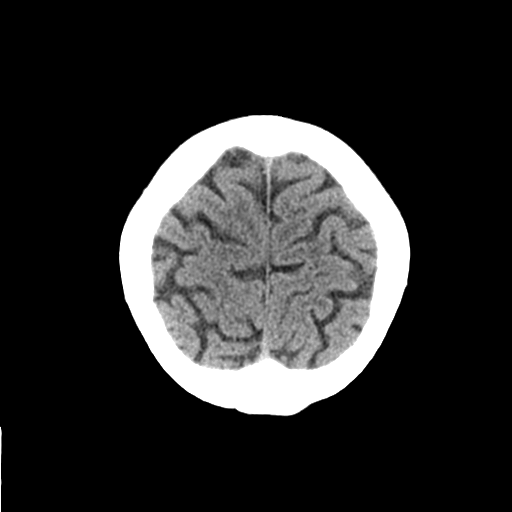
[im 23/29  bone]
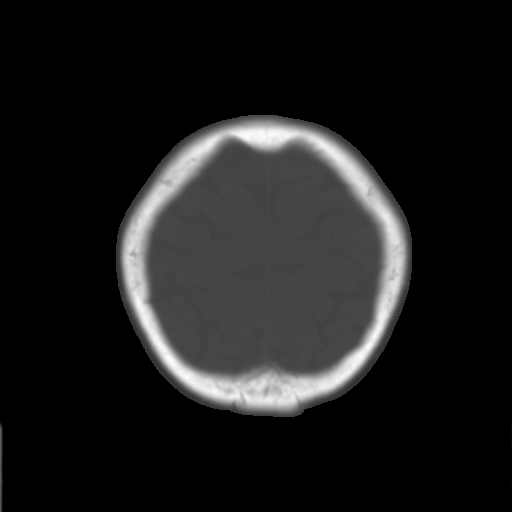
[im 24/29  brain]
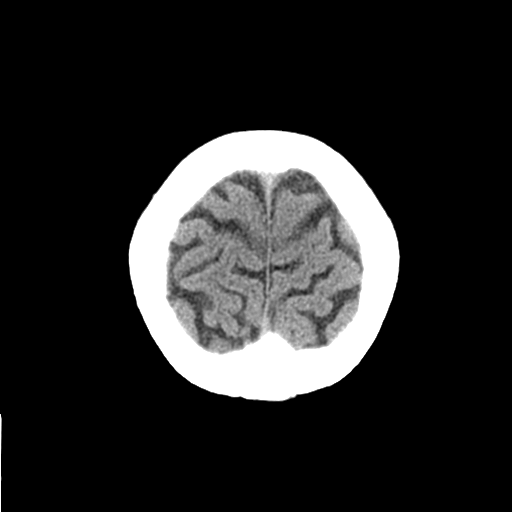
[im 26/29  brain]
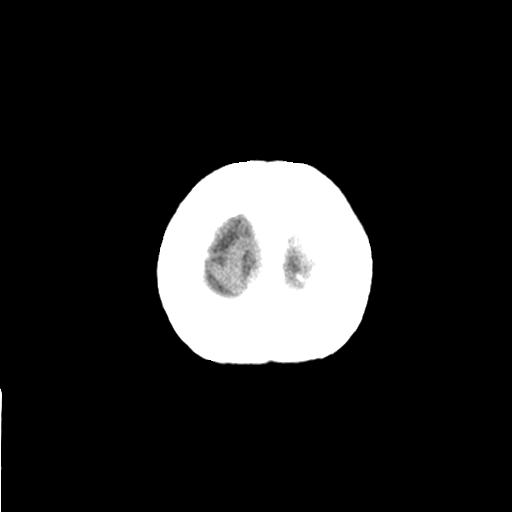
[im 28/29  brain]
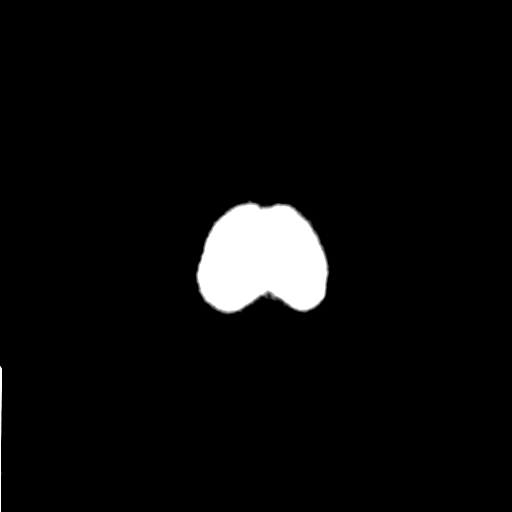

[16 of 29 positions shown; findings below may reference images not displayed]

FINDINGS: Brain: No evidence of acute infarction, hemorrhage, hydrocephalus,
extra-axial collection or mass lesion/mass effect.

Vascular: No hyperdense vessel or unexpected calcification.

Skull: Normal. Negative for fracture or focal lesion.

Sinuses/Orbits: No acute finding.

Other: None
IMPRESSION: Negative non contrasted CT appearance of the brain.

## 2022-01-23 ENCOUNTER — Ambulatory Visit: Payer: BC Managed Care – PPO | Admitting: Nurse Practitioner

## 2022-01-23 ENCOUNTER — Encounter: Payer: Self-pay | Admitting: Nurse Practitioner

## 2022-01-23 VITALS — BP 118/82 | HR 81 | Temp 96.0°F | Wt 196.2 lb

## 2022-01-23 DIAGNOSIS — N3281 Overactive bladder: Secondary | ICD-10-CM

## 2022-01-23 DIAGNOSIS — D1803 Hemangioma of intra-abdominal structures: Secondary | ICD-10-CM

## 2022-01-23 DIAGNOSIS — G43109 Migraine with aura, not intractable, without status migrainosus: Secondary | ICD-10-CM

## 2022-01-23 MED ORDER — SUMATRIPTAN SUCCINATE 25 MG PO TABS: 25.0000 mg | ORAL_TABLET | ORAL | 1 refills | Status: DC | PRN

## 2022-01-23 NOTE — Assessment & Plan Note (Signed)
She has a history of overactive bladder.  She was started on medication for this but it caused constipation.  She is not taking anything for this now and denies worsening symptoms. ?

## 2022-01-23 NOTE — Assessment & Plan Note (Signed)
She had a follow-up CT scan in 2013 which showed that the hemangiomas were stable.  She was told that she did not need any further follow-up. ?

## 2022-01-23 NOTE — Patient Instructions (Signed)
It was great to see you! ? ?Start imitrex 1 tablet at first sign of a migraine, you can repeat this in 2 hours if needed.  ? ?Let's follow-up in 2 months, sooner if you have concerns. ? ?If a referral was placed today, you will be contacted for an appointment. Please note that routine referrals can sometimes take up to 3-4 weeks to process. Please call our office if you haven't heard anything after this time frame. ? ?Take care, ? ?Vance Peper, NP ? ?

## 2022-01-23 NOTE — Progress Notes (Deleted)
? ?There were no vitals taken for this visit.  ? ?Subjective:  ? ? Patient ID: Margaret Durham, female    DOB: 27-Jul-1971, 51 y.o.   MRN: [id number] ? ?HPI: ?Margaret Durham is a 51 y.o. female presenting on 01/23/2022 to transfer care to a new provider and for comprehensive medical examination. Current medical complaints include:{Blank single:19197::"none","***"} ? ?She currently lives with: ?Menopausal Symptoms: {Blank single:19197::"yes","no"} ? ?Depression Screen done today and results listed below:  ? ?  12/06/2020  ?  1:52 PM 07/26/2019  ? 11:04 AM 08/25/2017  ?  7:36 AM  ?Depression screen PHQ 2/9  ?Decreased Interest 0 0 0  ?Down, Depressed, Hopeless 0 0 0  ?PHQ - 2 Score 0 0 0  ? ? ?The patient {has/does not have:19849} a history of falls. I {did/did not:19850} complete a risk assessment for falls. A plan of care for falls {was/was not:19852} documented. ? ? ?Past Medical History:  ?Past Medical History:  ?Diagnosis Date  ? Bacterial vaginosis   ? recurrent   ? Bell's palsy   ? Fibroids 2006  ? Hemangioma 2012  ? found on CT scan (liver)  ? History of D&C 1986  ? Hx of bladder infections   ? Hx of migraine headaches   ? IBS (irritable bowel syndrome)   ? MVP (mitral valve prolapse)   ? MVP (mitral valve prolapse)   ? Otitis 2012  ? ? ?Surgical History:  ?Past Surgical History:  ?Procedure Laterality Date  ? CESAREAN SECTION  2010  ? ENDOMETRIAL BIOPSY  2012  ? TOOTH EXTRACTION    ? ? ?Medications:  ?Current Outpatient Medications on File Prior to Visit  ?Medication Sig  ? Chelated Magnesium 100 MG TABS Take 100 mg by mouth daily.  ? cholecalciferol (VITAMIN D3) 25 MCG (1000 UNIT) tablet Take 1,000 Units by mouth daily.  ? vitamin B-12 (CYANOCOBALAMIN) 1000 MCG tablet Take 1,000 mcg by mouth daily.  ? zinc gluconate 50 MG tablet Take 50 mg by mouth daily.  ? ?No current facility-administered medications on file prior to visit.  ? ? ?Allergies:  ?Allergies  ?Allergen Reactions  ? Macrobid [Nitrofurantoin  Macrocrystal] Itching  ? ? ?Social History:  ?Social History  ? ?Socioeconomic History  ? Marital status: Married  ?  Spouse name: Not on file  ? Number of children: Not on file  ? Years of education: Not on file  ? Highest education level: Not on file  ?Occupational History  ? Not on file  ?Tobacco Use  ? Smoking status: Never  ? Smokeless tobacco: Never  ?Vaping Use  ? Vaping Use: Never used  ?Substance and Sexual Activity  ? Alcohol use: Yes  ? Drug use: No  ? Sexual activity: Yes  ?  Birth control/protection: None  ?Other Topics Concern  ? Not on file  ?Social History Narrative  ? Not on file  ? ?Social Determinants of Health  ? ?Financial Resource Strain: Not on file  ?Food Insecurity: Not on file  ?Transportation Needs: Not on file  ?Physical Activity: Not on file  ?Stress: Not on file  ?Social Connections: Not on file  ?Intimate Partner Violence: Not on file  ? ?Social History  ? ?Tobacco Use  ?Smoking Status Never  ?Smokeless Tobacco Never  ? ?Social History  ? ?Substance and Sexual Activity  ?Alcohol Use Yes  ? ? ?Family History:  ?Family History  ?Problem Relation Age of Onset  ? Heart disease Maternal Grandmother   ? Hypertension  Maternal Grandmother   ? Diabetes Maternal Grandmother   ? Stroke Maternal Grandmother   ? Stroke Maternal Grandfather   ? Cancer Maternal Grandfather   ?     lung   ? Heart disease Maternal Aunt   ? Cancer Maternal Aunt   ?     breast  ? ? ?Past medical history, surgical history, medications, allergies, family history and social history reviewed with patient today and changes made to appropriate areas of the chart.  ? ?ROS ?All other ROS negative except what is listed above and in the HPI.  ? ?   ?Objective:  ?  ?There were no vitals taken for this visit.  ?Wt Readings from Last 3 Encounters:  ?12/06/20 197 lb 6.4 oz (89.5 kg)  ?07/25/20 189 lb (85.7 kg)  ?07/20/20 184 lb 15.5 oz (83.9 kg)  ?  ?Physical Exam ? ?Results for orders placed or performed in visit on 11/30/20   ?Vitamin B12  ?Result Value Ref Range  ? Vitamin B-12 444 211 - 911 pg/mL  ?Hemoglobin A1c  ?Result Value Ref Range  ? Hgb A1c MFr Bld 5.6 4.6 - 6.5 %  ?VITAMIN D 25 Hydroxy (Vit-D Deficiency, Fractures)  ?Result Value Ref Range  ? VITD 59.87 30.00 - 100.00 ng/mL  ?Lipid panel  ?Result Value Ref Range  ? Cholesterol 193 0 - 200 mg/dL  ? Triglycerides 40.0 0.0 - 149.0 mg/dL  ? HDL 67.30 >39.00 mg/dL  ? VLDL 8.0 0.0 - 40.0 mg/dL  ? LDL Cholesterol 118 (H) 0 - 99 mg/dL  ? Total CHOL/HDL Ratio 3   ? NonHDL 126.00   ?CBC  ?Result Value Ref Range  ? WBC 4.2 4.0 - 10.5 K/uL  ? RBC 4.47 3.87 - 5.11 Mil/uL  ? Platelets 345.0 150.0 - 400.0 K/uL  ? Hemoglobin 13.2 12.0 - 15.0 g/dL  ? HCT 39.1 36.0 - 46.0 %  ? MCV 87.5 78.0 - 100.0 fl  ? MCHC 33.7 30.0 - 36.0 g/dL  ? RDW 15.3 11.5 - 15.5 %  ?Basic metabolic panel  ?Result Value Ref Range  ? Sodium 142 135 - 145 mEq/L  ? Potassium 5.2 No hemolysis seen (H) 3.5 - 5.1 mEq/L  ? Chloride 105 96 - 112 mEq/L  ? CO2 33 (H) 19 - 32 mEq/L  ? Glucose, Bld 97 70 - 99 mg/dL  ? BUN 18 6 - 23 mg/dL  ? Creatinine, Ser 0.98 0.40 - 1.20 mg/dL  ? GFR 66.84 >60.00 mL/min  ? Calcium 9.8 8.4 - 10.5 mg/dL  ?AST  ?Result Value Ref Range  ? AST 17 0 - 37 U/L  ?ALT  ?Result Value Ref Range  ? ALT 12 0 - 35 U/L  ? ?   ?Assessment & Plan:  ? ?Problem List Items Addressed This Visit   ?None ?  ? ?Follow up plan: ?No follow-ups on file. ? ? ?LABORATORY TESTING:  ?- Pap smear: {Blank IZTIWP:80998::"PJA done","not applicable","up to date","done elsewhere"} ? ?IMMUNIZATIONS:   ?- Tdap: Tetanus vaccination status reviewed: {tetanus status:315746}. ?- Influenza: {Blank single:19197::"Up to date","Administered today","Postponed to flu season","Refused","Given elsewhere"} ?- Pneumovax: {Blank single:19197::"Up to date","Administered today","Not applicable","Refused","Given elsewhere"} ?- Prevnar: {Blank single:19197::"Up to date","Administered today","Not applicable","Refused","Given elsewhere"} ?- HPV: {Blank  single:19197::"Up to date","Administered today","Not applicable","Refused","Given elsewhere"} ?- Zostavax vaccine: {Blank single:19197::"Up to date","Administered today","Not applicable","Refused","Given elsewhere"} ? ?SCREENING: ?-Mammogram: {Blank single:19197::"Up to date","Ordered today","Not applicable","Refused","Done elsewhere"}  ?- Colonoscopy: {Blank single:19197::"Up to date","Ordered today","Not applicable","Refused","Done elsewhere"}  ?- Bone Density: {Blank single:19197::"Up to date","Ordered today","Not  applicable","Refused","Done elsewhere"}  ?-Hearing Test: {Blank single:19197::"Up to date","Ordered today","Not applicable","Refused","Done elsewhere"}  ?-Spirometry: {Blank single:19197::"Up to date","Ordered today","Not applicable","Refused","Done elsewhere"}  ? ?PATIENT COUNSELING:   ?Advised to take 1 mg of folate supplement per day if capable of pregnancy.  ? ?Sexuality: Discussed sexually transmitted diseases, partner selection, use of condoms, avoidance of unintended pregnancy  and contraceptive alternatives.  ? ?Advised to avoid cigarette smoking. ? ?I discussed with the patient that most people either abstain from alcohol or drink within safe limits (<=14/week and <=4 drinks/occasion for males, <=7/weeks and <= 3 drinks/occasion for females) and that the risk for alcohol disorders and other health effects rises proportionally with the number of drinks per week and how often a drinker exceeds daily limits. ? ?Discussed cessation/primary prevention of drug use and availability of treatment for abuse.  ? ?Diet: Encouraged to adjust caloric intake to maintain  or achieve ideal body weight, to reduce intake of dietary saturated fat and total fat, to limit sodium intake by avoiding high sodium foods and not adding table salt, and to maintain adequate dietary potassium and calcium preferably from fresh fruits, vegetables, and low-fat dairy products.   ? ?stressed the importance of regular  exercise ? ?Injury prevention: Discussed safety belts, safety helmets, smoke detector, smoking near bedding or upholstery.  ? ?Dental health: Discussed importance of regular tooth brushing, flossing, and dental visits.  ? ? ?

## 2022-01-23 NOTE — Progress Notes (Signed)
? ?Established Patient Office Visit ? ?Subjective:  ?Patient ID: Margaret Durham, female    DOB: 11/09/1970  Age: 51 y.o. MRN: [id number] ? ?CC:  ?Chief Complaint  ?Patient presents with  ? Establish Care  ?  Np. Est care. Pt c/o chronic migraines for several months, requesting referral for neurologist  ? ? ?HPI ?Margaret Durham presents for transfer care to a new provider.  Introduced to Designer, jewellery role and practice setting.  All questions answered.  Discussed provider/patient relationship and expectations. ? ?Margaret Durham has been having more headaches recently.  She does note increased stress at work. She tries to increase the fluids she has been drinking and eat regularly. She takes 1 Excedrin migraine for headaches.  Her headaches are associated with sensitivity to light and sound.  She denies chest pain, shortness of breath, nausea or vomiting. She endorses visual changes before headache. She does not drink caffeine every day. She gets about 1 headache a month.  She would like a referral to a neurologist. ? ?Past Medical History:  ?Diagnosis Date  ? Bacterial vaginosis   ? recurrent   ? Bell's palsy   ? Fibroids 10/27/2004  ? GERD (gastroesophageal reflux disease)   ? Hemangioma 10/27/2010  ? found on CT scan (liver)  ? History of D&C 10/27/1984  ? Hx of bladder infections   ? Hx of migraine headaches   ? IBS (irritable bowel syndrome)   ? MVP (mitral valve prolapse)   ? Otitis 10/27/2010  ? ? ?Past Surgical History:  ?Procedure Laterality Date  ? BUNIONECTOMY    ? CESAREAN SECTION  10/27/2008  ? ENDOMETRIAL BIOPSY  10/27/2010  ? TOOTH EXTRACTION    ? ? ?Family History  ?Problem Relation Age of Onset  ? Heart disease Maternal Grandmother   ? Hypertension Maternal Grandmother   ? Diabetes Maternal Grandmother   ? Stroke Maternal Grandmother   ? Stroke Maternal Grandfather   ? Cancer Maternal Grandfather   ?     lung   ? Heart disease Maternal Aunt   ? Cancer Maternal Aunt   ?     breast  ? ? ?Social  History  ? ?Socioeconomic History  ? Marital status: Married  ?  Spouse name: Not on file  ? Number of children: Not on file  ? Years of education: Not on file  ? Highest education level: Not on file  ?Occupational History  ? Not on file  ?Tobacco Use  ? Smoking status: Never  ? Smokeless tobacco: Never  ?Vaping Use  ? Vaping Use: Never used  ?Substance and Sexual Activity  ? Alcohol use: Not Currently  ? Drug use: No  ? Sexual activity: Yes  ?  Birth control/protection: None  ?Other Topics Concern  ? Not on file  ?Social History Narrative  ? Not on file  ? ?Social Determinants of Health  ? ?Financial Resource Strain: Not on file  ?Food Insecurity: Not on file  ?Transportation Needs: Not on file  ?Physical Activity: Not on file  ?Stress: Not on file  ?Social Connections: Not on file  ?Intimate Partner Violence: Not on file  ? ? ?Outpatient Medications Prior to Visit  ?Medication Sig Dispense Refill  ? Azelaic Acid 15 % gel 1 application to affected area    ? metroNIDAZOLE (METROCREAM) 9.67 % cream 1 application    ? Chelated Magnesium 100 MG TABS Take 100 mg by mouth daily.    ? cholecalciferol (VITAMIN D3) 25 MCG (1000 UNIT)  tablet Take 1,000 Units by mouth daily.    ? vitamin B-12 (CYANOCOBALAMIN) 1000 MCG tablet Take 1,000 mcg by mouth daily.    ? zinc gluconate 50 MG tablet Take 50 mg by mouth daily.    ? ?No facility-administered medications prior to visit.  ? ? ?Allergies  ?Allergen Reactions  ? Macrobid [Nitrofurantoin Macrocrystal] Itching  ? ? ?ROS ?Review of Systems  ?Constitutional:  Positive for fatigue.  ?HENT: Negative.    ?Respiratory: Negative.    ?Cardiovascular: Negative.   ?Gastrointestinal:  Positive for diarrhea (nervous stomach).  ?Genitourinary:  Positive for frequency. Negative for dysuria.  ?Musculoskeletal: Negative.   ?Skin: Negative.   ?Neurological:  Positive for headaches. Negative for dizziness.  ?Psychiatric/Behavioral:  The patient is nervous/anxious.   ? ?  ?Objective:  ?  ?Physical  Exam ?Vitals and nursing note reviewed.  ?Constitutional:   ?   General: She is not in acute distress. ?   Appearance: Normal appearance.  ?HENT:  ?   Head: Normocephalic and atraumatic.  ?Eyes:  ?   Extraocular Movements: Extraocular movements intact.  ?   Conjunctiva/sclera: Conjunctivae normal.  ?Cardiovascular:  ?   Rate and Rhythm: Normal rate and regular rhythm.  ?   Pulses: Normal pulses.  ?   Heart sounds: Normal heart sounds.  ?Pulmonary:  ?   Effort: Pulmonary effort is normal.  ?   Breath sounds: Normal breath sounds.  ?Abdominal:  ?   Palpations: Abdomen is soft.  ?   Tenderness: There is no abdominal tenderness.  ?Musculoskeletal:  ?   Cervical back: Normal range of motion. No tenderness.  ?Lymphadenopathy:  ?   Cervical: No cervical adenopathy.  ?Skin: ?   General: Skin is warm and dry.  ?Neurological:  ?   General: No focal deficit present.  ?   Mental Status: She is alert and oriented to person, place, and time.  ?   Motor: No weakness.  ?   Coordination: Coordination normal.  ?Psychiatric:     ?   Mood and Affect: Mood normal.     ?   Behavior: Behavior normal.     ?   Thought Content: Thought content normal.     ?   Judgment: Judgment normal.  ? ? ?BP 118/82 (BP Location: Right Arm, Patient Position: Sitting, Cuff Size: Normal)   Pulse 81   Temp (!) 96 ?F (35.6 ?C) (Temporal)   Wt 196 lb 3.2 oz (89 kg)   SpO2 97%   BMI 32.15 kg/m?  ?Wt Readings from Last 3 Encounters:  ?01/23/22 196 lb 3.2 oz (89 kg)  ?12/06/20 197 lb 6.4 oz (89.5 kg)  ?07/25/20 189 lb (85.7 kg)  ? ? ? ?Health Maintenance Due  ?Topic Date Due  ? Hepatitis C Screening  Never done  ? Zoster Vaccines- Shingrix (2 of 2) 01/31/2021  ? MAMMOGRAM  08/29/2021  ? ? ?There are no preventive care reminders to display for this patient. ? ?Lab Results  ?Component Value Date  ? TSH 0.43 07/26/2019  ? ?Lab Results  ?Component Value Date  ? WBC 4.2 11/30/2020  ? HGB 13.2 11/30/2020  ? HCT 39.1 11/30/2020  ? MCV 87.5 11/30/2020  ? PLT 345.0  11/30/2020  ? ?Lab Results  ?Component Value Date  ? NA 142 11/30/2020  ? K 5.2 No hemolysis seen (H) 11/30/2020  ? CO2 33 (H) 11/30/2020  ? GLUCOSE 97 11/30/2020  ? BUN 18 11/30/2020  ? CREATININE 0.98 11/30/2020  ? BILITOT  0.5 07/26/2019  ? ALKPHOS 81 07/26/2019  ? AST 17 11/30/2020  ? ALT 12 11/30/2020  ? PROT 6.8 07/26/2019  ? ALBUMIN 4.1 07/26/2019  ? CALCIUM 9.8 11/30/2020  ? GFR 66.84 11/30/2020  ? ?Lab Results  ?Component Value Date  ? CHOL 193 11/30/2020  ? ?Lab Results  ?Component Value Date  ? HDL 67.30 11/30/2020  ? ?Lab Results  ?Component Value Date  ? LDLCALC 118 (H) 11/30/2020  ? ?Lab Results  ?Component Value Date  ? TRIG 40.0 11/30/2020  ? ?Lab Results  ?Component Value Date  ? CHOLHDL 3 11/30/2020  ? ?Lab Results  ?Component Value Date  ? HGBA1C 5.6 11/30/2020  ? ? ?  ?Assessment & Plan:  ? ?Problem List Items Addressed This Visit   ? ?  ? Cardiovascular and Mediastinum  ? Hemangioma of liver  ?  She had a follow-up CT scan in 2013 which showed that the hemangiomas were stable.  She was told that she did not need any further follow-up. ?  ?  ? Migraine with aura and without status migrainosus, not intractable - Primary  ?  She has a history of migraines that have gotten more frequent in nature.  She states that she gets a headache about once a month.  This is associated with aura.  She has not tried any prescription medications, she does not take Excedrin even though it sometimes upsets her stomach.  We will start Imitrex 25 mg daily as needed, can take a second dose in 2 hours if not improved.  Discussed possible side effects.  We will also place a referral to neurology.  Discussed that her recent increase in stress with work could be causing her increased frequency of migraines. ?  ?  ? Relevant Medications  ? SUMAtriptan (IMITREX) 25 MG tablet  ? Other Relevant Orders  ? Ambulatory referral to Neurology  ?  ? Genitourinary  ? OAB (overactive bladder)  ?  She has a history of overactive bladder.   She was started on medication for this but it caused constipation.  She is not taking anything for this now and denies worsening symptoms. ?  ?  ? ? ?Meds ordered this encounter  ?Medications  ? SUMAtriptan (I

## 2022-01-23 NOTE — Assessment & Plan Note (Signed)
She has a history of migraines that have gotten more frequent in nature.  She states that she gets a headache about once a month.  This is associated with aura.  She has not tried any prescription medications, she does not take Excedrin even though it sometimes upsets her stomach.  We will start Imitrex 25 mg daily as needed, can take a second dose in 2 hours if not improved.  Discussed possible side effects.  We will also place a referral to neurology.  Discussed that her recent increase in stress with work could be causing her increased frequency of migraines. ?

## 2022-01-24 ENCOUNTER — Encounter: Payer: Self-pay | Admitting: Neurology

## 2022-03-21 NOTE — Progress Notes (Unsigned)
There were no vitals taken for this visit.   Subjective:    Patient ID: Margaret Durham, female    DOB: 12-17-1970, 51 y.o.   MRN: [id number]  CC: No chief complaint on file.   HPI: Margaret Durham is a 51 y.o. female presenting on 03/25/2022 for comprehensive medical examination. Current medical complaints include: migraines  Last visit she was started on imitrex as needed for migraines.   She currently lives with: Menopausal Symptoms: {Blank single:19197::"yes","no"}  Depression Screen done today and results listed below:     01/23/2022    3:36 PM 12/06/2020    1:52 PM 07/26/2019   11:04 AM 08/25/2017    7:36 AM  Depression screen PHQ 2/9  Decreased Interest 1 0 0 0  Down, Depressed, Hopeless 2 0 0 0  PHQ - 2 Score 3 0 0 0  Altered sleeping 2     Tired, decreased energy 3     Change in appetite 3     Feeling bad or failure about yourself  0     Trouble concentrating 1     Moving slowly or fidgety/restless 0     Suicidal thoughts 0     PHQ-9 Score 12     Difficult doing work/chores Somewhat difficult       The patient {has/does not have:19849} a history of falls. I {did/did not:19850} complete a risk assessment for falls. A plan of care for falls {was/was not:19852} documented.   Past Medical History:  Past Medical History:  Diagnosis Date   Bacterial vaginosis    recurrent    Bell's palsy    Fibroids 10/27/2004   GERD (gastroesophageal reflux disease)    Hemangioma 10/27/2010   found on CT scan (liver)   History of D&C 10/27/1984   Hx of bladder infections    Hx of migraine headaches    IBS (irritable bowel syndrome)    MVP (mitral valve prolapse)    Otitis 10/27/2010    Surgical History:  Past Surgical History:  Procedure Laterality Date   BUNIONECTOMY     CESAREAN SECTION  10/27/2008   ENDOMETRIAL BIOPSY  10/27/2010   TOOTH EXTRACTION      Medications:  Current Outpatient Medications on File Prior to Visit  Medication Sig   Chelated  Magnesium 100 MG TABS Take 100 mg by mouth daily.   cholecalciferol (VITAMIN D3) 25 MCG (1000 UNIT) tablet Take 1,000 Units by mouth daily.   SUMAtriptan (IMITREX) 25 MG tablet Take 1 tablet (25 mg total) by mouth every 2 (two) hours as needed for migraine. May repeat in 2 hours if headache persists or recurs.   vitamin B-12 (CYANOCOBALAMIN) 1000 MCG tablet Take 1,000 mcg by mouth daily.   zinc gluconate 50 MG tablet Take 50 mg by mouth daily.   No current facility-administered medications on file prior to visit.    Allergies:  Allergies  Allergen Reactions   Macrobid [Nitrofurantoin Macrocrystal] Itching    Social History:  Social History   Socioeconomic History   Marital status: Married    Spouse name: Not on file   Number of children: Not on file   Years of education: Not on file   Highest education level: Not on file  Occupational History   Not on file  Tobacco Use   Smoking status: Never   Smokeless tobacco: Never  Vaping Use   Vaping Use: Never used  Substance and Sexual Activity   Alcohol use: Not Currently   Drug  use: No   Sexual activity: Yes    Birth control/protection: None  Other Topics Concern   Not on file  Social History Narrative   Not on file   Social Determinants of Health   Financial Resource Strain: Not on file  Food Insecurity: Not on file  Transportation Needs: Not on file  Physical Activity: Not on file  Stress: Not on file  Social Connections: Not on file  Intimate Partner Violence: Not on file   Social History   Tobacco Use  Smoking Status Never  Smokeless Tobacco Never   Social History   Substance and Sexual Activity  Alcohol Use Not Currently    Family History:  Family History  Problem Relation Age of Onset   Heart disease Maternal Grandmother    Hypertension Maternal Grandmother    Diabetes Maternal Grandmother    Stroke Maternal Grandmother    Stroke Maternal Grandfather    Cancer Maternal Grandfather        lung     Heart disease Maternal Aunt    Cancer Maternal Aunt        breast    Past medical history, surgical history, medications, allergies, family history and social history reviewed with patient today and changes made to appropriate areas of the chart.   ROS All other ROS negative except what is listed above and in the HPI.      Objective:    There were no vitals taken for this visit.  Wt Readings from Last 3 Encounters:  01/23/22 196 lb 3.2 oz (89 kg)  12/06/20 197 lb 6.4 oz (89.5 kg)  07/25/20 189 lb (85.7 kg)    Physical Exam  Results for orders placed or performed in visit on 11/30/20  Vitamin B12  Result Value Ref Range   Vitamin B-12 444 211 - 911 pg/mL  Hemoglobin A1c  Result Value Ref Range   Hgb A1c MFr Bld 5.6 4.6 - 6.5 %  VITAMIN D 25 Hydroxy (Vit-D Deficiency, Fractures)  Result Value Ref Range   VITD 59.87 30.00 - 100.00 ng/mL  Lipid panel  Result Value Ref Range   Cholesterol 193 0 - 200 mg/dL   Triglycerides 40.0 0.0 - 149.0 mg/dL   HDL 67.30 >39.00 mg/dL   VLDL 8.0 0.0 - 40.0 mg/dL   LDL Cholesterol 118 (H) 0 - 99 mg/dL   Total CHOL/HDL Ratio 3    NonHDL 126.00   CBC  Result Value Ref Range   WBC 4.2 4.0 - 10.5 K/uL   RBC 4.47 3.87 - 5.11 Mil/uL   Platelets 345.0 150.0 - 400.0 K/uL   Hemoglobin 13.2 12.0 - 15.0 g/dL   HCT 39.1 36.0 - 46.0 %   MCV 87.5 78.0 - 100.0 fl   MCHC 33.7 30.0 - 36.0 g/dL   RDW 15.3 11.5 - 86.7 %  Basic metabolic panel  Result Value Ref Range   Sodium 142 135 - 145 mEq/L   Potassium 5.2 No hemolysis seen (H) 3.5 - 5.1 mEq/L   Chloride 105 96 - 112 mEq/L   CO2 33 (H) 19 - 32 mEq/L   Glucose, Bld 97 70 - 99 mg/dL   BUN 18 6 - 23 mg/dL   Creatinine, Ser 0.98 0.40 - 1.20 mg/dL   GFR 66.84 >60.00 mL/min   Calcium 9.8 8.4 - 10.5 mg/dL  AST  Result Value Ref Range   AST 17 0 - 37 U/L  ALT  Result Value Ref Range   ALT 12 0 -  35 U/L      Assessment & Plan:   Problem List Items Addressed This Visit   None    Follow up  plan: No follow-ups on file.   LABORATORY TESTING:  - Pap smear: up to date  IMMUNIZATIONS:   - Tdap: Tetanus vaccination status reviewed: last tetanus booster within 10 years. - Influenza: Postponed to flu season - Pneumovax: Not applicable - Prevnar: Not applicable - HPV: Not applicable - Zostavax vaccine: {Blank single:19197::"Up to date","Administered today","Not applicable","Refused","Given elsewhere"}  SCREENING: -Mammogram: {Blank single:19197::"Up to date","Ordered today","Not applicable","Refused","Done elsewhere"}  - Colonoscopy: Up to date  - Bone Density: Not applicable  -Hearing Test: Not applicable  -Spirometry: Not applicable   PATIENT COUNSELING:   Advised to take 1 mg of folate supplement per day if capable of pregnancy.   Sexuality: Discussed sexually transmitted diseases, partner selection, use of condoms, avoidance of unintended pregnancy  and contraceptive alternatives.   Advised to avoid cigarette smoking.  I discussed with the patient that most people either abstain from alcohol or drink within safe limits (<=14/week and <=4 drinks/occasion for males, <=7/weeks and <= 3 drinks/occasion for females) and that the risk for alcohol disorders and other health effects rises proportionally with the number of drinks per week and how often a drinker exceeds daily limits.  Discussed cessation/primary prevention of drug use and availability of treatment for abuse.   Diet: Encouraged to adjust caloric intake to maintain  or achieve ideal body weight, to reduce intake of dietary saturated fat and total fat, to limit sodium intake by avoiding high sodium foods and not adding table salt, and to maintain adequate dietary potassium and calcium preferably from fresh fruits, vegetables, and low-fat dairy products.    stressed the importance of regular exercise  Injury prevention: Discussed safety belts, safety helmets, smoke detector, smoking near bedding or upholstery.    Dental health: Discussed importance of regular tooth brushing, flossing, and dental visits.    NEXT PREVENTATIVE PHYSICAL DUE IN 1 YEAR. No follow-ups on file.

## 2022-03-25 ENCOUNTER — Ambulatory Visit (INDEPENDENT_AMBULATORY_CARE_PROVIDER_SITE_OTHER): Payer: BC Managed Care – PPO | Admitting: Nurse Practitioner

## 2022-03-25 ENCOUNTER — Encounter: Payer: Self-pay | Admitting: Nurse Practitioner

## 2022-03-25 VITALS — BP 102/76 | HR 82 | Temp 97.7°F | Wt 187.0 lb

## 2022-03-25 DIAGNOSIS — Z0000 Encounter for general adult medical examination without abnormal findings: Secondary | ICD-10-CM

## 2022-03-25 DIAGNOSIS — Z23 Encounter for immunization: Secondary | ICD-10-CM

## 2022-03-25 DIAGNOSIS — E785 Hyperlipidemia, unspecified: Secondary | ICD-10-CM | POA: Diagnosis not present

## 2022-03-25 DIAGNOSIS — R5383 Other fatigue: Secondary | ICD-10-CM

## 2022-03-25 DIAGNOSIS — E559 Vitamin D deficiency, unspecified: Secondary | ICD-10-CM | POA: Diagnosis not present

## 2022-03-25 DIAGNOSIS — Z1159 Encounter for screening for other viral diseases: Secondary | ICD-10-CM

## 2022-03-25 DIAGNOSIS — G43109 Migraine with aura, not intractable, without status migrainosus: Secondary | ICD-10-CM | POA: Diagnosis not present

## 2022-03-25 LAB — VITAMIN B12: Vitamin B-12: 162 pg/mL — ABNORMAL LOW (ref 211–911)

## 2022-03-25 LAB — COMPREHENSIVE METABOLIC PANEL
ALT: 11 U/L (ref 0–35)
AST: 14 U/L (ref 0–37)
Albumin: 4.5 g/dL (ref 3.5–5.2)
Alkaline Phosphatase: 81 U/L (ref 39–117)
BUN: 15 mg/dL (ref 6–23)
CO2: 32 mEq/L (ref 19–32)
Calcium: 10.2 mg/dL (ref 8.4–10.5)
Chloride: 103 mEq/L (ref 96–112)
Creatinine, Ser: 0.94 mg/dL (ref 0.40–1.20)
GFR: 69.62 mL/min (ref >60.00)
Glucose, Bld: 98 mg/dL (ref 70–99)
Potassium: 5 mEq/L (ref 3.5–5.1)
Sodium: 141 mEq/L (ref 135–145)
Total Bilirubin: 0.6 mg/dL (ref 0.2–1.2)
Total Protein: 6.6 g/dL (ref 6.0–8.3)

## 2022-03-25 LAB — LIPID PANEL
Cholesterol: 194 mg/dL (ref 0–200)
HDL: 62.3 mg/dL (ref >39.00)
LDL Cholesterol: 123 mg/dL — ABNORMAL HIGH (ref 0–99)
NonHDL: 132.16
Total CHOL/HDL Ratio: 3
Triglycerides: 46 mg/dL (ref 0.0–149.0)
VLDL: 9.2 mg/dL (ref 0.0–40.0)

## 2022-03-25 LAB — CBC WITH DIFFERENTIAL/PLATELET
Basophils Absolute: 0 10*3/uL (ref 0.0–0.1)
Basophils Relative: 0.9 % (ref 0.0–3.0)
Eosinophils Absolute: 0.1 10*3/uL (ref 0.0–0.7)
Eosinophils Relative: 1.2 % (ref 0.0–5.0)
HCT: 40.8 % (ref 36.0–46.0)
Hemoglobin: 13.8 g/dL (ref 12.0–15.0)
Lymphocytes Relative: 37.9 % (ref 12.0–46.0)
Lymphs Abs: 1.7 10*3/uL (ref 0.7–4.0)
MCHC: 33.7 g/dL (ref 30.0–36.0)
MCV: 91.2 fl (ref 78.0–100.0)
Monocytes Absolute: 0.5 10*3/uL (ref 0.1–1.0)
Monocytes Relative: 10.6 % (ref 3.0–12.0)
Neutro Abs: 2.2 10*3/uL (ref 1.4–7.7)
Neutrophils Relative %: 49.4 % (ref 43.0–77.0)
Platelets: 315 10*3/uL (ref 150.0–400.0)
RBC: 4.47 Mil/uL (ref 3.87–5.11)
RDW: 13.8 % (ref 11.5–15.5)
WBC: 4.5 10*3/uL (ref 4.0–10.5)

## 2022-03-25 LAB — VITAMIN D 25 HYDROXY (VIT D DEFICIENCY, FRACTURES): VITD: 66.65 ng/mL (ref 30.00–100.00)

## 2022-03-25 LAB — TSH: TSH: 0.83 u[IU]/mL (ref 0.35–5.50)

## 2022-03-25 MED ORDER — SUMATRIPTAN SUCCINATE 50 MG PO TABS: 50.0000 mg | ORAL_TABLET | ORAL | 6 refills | Status: DC | PRN

## 2022-03-25 NOTE — Patient Instructions (Signed)
It was great to see you!  I have sent in the '50mg'$  imitrex to take as needed for migraine. You can take a second dose in 2 hours if the first didn't help.  We are checking your labs today and will let you know the results via mychart/phone.    Let's follow-up in 1 year, sooner if you have concerns.  If a referral was placed today, you will be contacted for an appointment. Please note that routine referrals can sometimes take up to 3-4 weeks to process. Please call our office if you haven't heard anything after this time frame.  Take care,  Vance Peper, NP

## 2022-03-25 NOTE — Assessment & Plan Note (Signed)
Chronic, ongoing. Will check CMP, CBC, TSH, and vitamin B12 today.

## 2022-03-25 NOTE — Assessment & Plan Note (Signed)
Chronic, ongoing.  Last LDL was elevated when it was checked last year.  We will check lipid panel today.

## 2022-03-25 NOTE — Assessment & Plan Note (Signed)
Chronic, ongoing.  She gets about 1 headache per month with an aura.  She was started on Imitrex as needed for headache, she took 1 dose but it did not go away so she took a second dose 2 hours later.  This helped him completely make it go away so she took a third dose again later on the evening.  After this third dose her migraine finally went away.  We will increase her Imitrex to 50 mg as needed migraine, can take a second dose after 2 hours.  Follow-up in 6 months or sooner with concerns.

## 2022-03-25 NOTE — Assessment & Plan Note (Signed)
She has a history of vitamin D sufficiency and is taking a supplement daily.  We will check levels today and treat based on results.

## 2022-03-26 LAB — HEPATITIS C ANTIBODY
Hepatitis C Ab: NONREACTIVE
SIGNAL TO CUT-OFF: 0.13 (ref <1.00)

## 2022-04-16 ENCOUNTER — Other Ambulatory Visit: Payer: Self-pay | Admitting: Nurse Practitioner

## 2022-04-16 NOTE — Telephone Encounter (Signed)
Dosage change

## 2022-05-02 ENCOUNTER — Other Ambulatory Visit: Payer: Self-pay | Admitting: Nurse Practitioner

## 2022-05-05 ENCOUNTER — Encounter: Payer: Self-pay | Admitting: Nurse Practitioner

## 2022-05-05 ENCOUNTER — Telehealth: Payer: BC Managed Care – PPO | Admitting: Nurse Practitioner

## 2022-05-05 VITALS — Temp 101.0°F

## 2022-05-05 DIAGNOSIS — U071 COVID-19: Secondary | ICD-10-CM

## 2022-05-05 NOTE — Patient Instructions (Signed)
It was great to see you!  Start the medication that was prescribed for your COVID infection.  Make sure you are getting rest and drinking plenty of fluids.  You can take Tylenol as needed for your fever and headache.  Make sure you limit going out in public for the next 5 days.  Wear a mask if you need to go out for the next 10 days.  Let's follow-up if symptoms worsen or any concerns.   Take care,  Vance Peper, NP

## 2022-05-05 NOTE — Progress Notes (Signed)
[facility] [address] [place] [address] Dept: [phone] Dept Fax: [phone]  Virtual Video Visit  I connected with Margaret Durham on 05/05/22 at  4:20 PM EDT by a video enabled telemedicine application and verified that I am speaking with the correct person using two identifiers.  Location patient: Home Location provider: Clinic Persons participating in the virtual visit: Patient; Vance Peper, NP; Marchia Bond, CMA  I discussed the limitations of evaluation and management by telemedicine and the availability of in person appointments. The patient expressed understanding and agreed to proceed.  Chief Complaint  Patient presents with   URI    Pt c/o Coughing, headache, fever, chills, and body aches x1 day. Pt tested pos for COVID today 05/05/22 at [facility]    SUBJECTIVE:  HPI: Margaret Durham is a 51 y.o. female who presents with cough, headache, fever and body aches x1 day. She had a positive covid-19 test at [facility] today. She was prescribed covid-19 medication from that provider.   UPPER RESPIRATORY TRACT INFECTION  Fever: yes Cough: yes Shortness of breath: no Wheezing: no Chest pain: no Chest tightness: no Chest congestion: no Nasal congestion: no Runny nose: no Post nasal drip: no Sneezing: no Sore throat: yes Swollen glands: no Sinus pressure: no Headache: yes Face pain: no Toothache: no Ear pain: no bilateral Ear pressure: no bilateral Eyes red/itching:no Eye drainage/crusting: no  Vomiting: no Rash: no Fatigue: yes Sick contacts: yes Strep contacts: no  Context: stable Recurrent sinusitis: no Relief with OTC cold/cough medications: yes  Treatments attempted: halls cough drops    Patient Active Problem List   Diagnosis Date Noted   COVID-19 05/05/2022   Vitamin D deficiency 03/25/2022   Hyperlipidemia 03/25/2022   Fatigue 07/26/2019   Hemangioma of liver  03/11/2018   Irritable bowel syndrome 03/11/2018   Migraine with aura and without status migrainosus, not intractable 03/11/2018   Mitral valve prolapse 03/11/2018   Nocturia 08/25/2017   OAB (overactive bladder) 08/25/2017   Well woman exam 04/18/2015   Fibroids 05/14/2012   Multiple hemangiomas 05/14/2012    Past Surgical History:  Procedure Laterality Date   BUNIONECTOMY     CESAREAN SECTION  10/27/2008   ENDOMETRIAL BIOPSY  10/27/2010   TOOTH EXTRACTION      Family History  Problem Relation Age of Onset   Heart disease Maternal Grandmother    Hypertension Maternal Grandmother    Diabetes Maternal Grandmother    Stroke Maternal Grandmother    Stroke Maternal Grandfather    Cancer Maternal Grandfather        lung    Heart disease Maternal Aunt    Cancer Maternal Aunt        breast    Social History   Tobacco Use   Smoking status: Never   Smokeless tobacco: Never  Vaping Use   Vaping Use: Never used  Substance Use Topics   Alcohol use: Not Currently   Drug use: No     Current Outpatient Medications:    Chelated Magnesium 100 MG TABS, Take 100 mg by mouth daily., Disp: , Rfl:    cholecalciferol (VITAMIN D3) 25 MCG (1000 UNIT) tablet, Take 1,000 Units by mouth daily., Disp: , Rfl:    OMEGA-3 FATTY ACIDS PO, Take by mouth., Disp: , Rfl:    omeprazole-sodium bicarbonate (ZEGERID) 40-1100 MG capsule, , Disp: , Rfl:    OVER THE COUNTER MEDICATION, RESVERATROL, Disp: , Rfl:    SUMAtriptan (  IMITREX) 25 MG tablet, TAKE 1 AS NEEDED FOR MIGRAINE. MAY REPEAT IN 2 HOURS IF HEADACHE PERSISTS OR RECURS., Disp: 10 tablet, Rfl: 1   SUMAtriptan (IMITREX) 50 MG tablet, Take 1 tablet (50 mg total) by mouth every 2 (two) hours as needed for migraine. May repeat in 2 hours if headache persists or recurs., Disp: 10 tablet, Rfl: 6   vitamin B-12 (CYANOCOBALAMIN) 1000 MCG tablet, Take 1,000 mcg by mouth daily., Disp: , Rfl:    zinc gluconate 50 MG tablet, Take 50 mg by mouth daily.,  Disp: , Rfl:   Allergies  Allergen Reactions   Macrobid [Nitrofurantoin Macrocrystal] Itching    ROS: See pertinent positives and negatives per HPI.  OBSERVATIONS/OBJECTIVE:  VITALS per patient if applicable: Today's Vitals   05/05/22 1629  Temp: (!) 101 F (38.3 C)  TempSrc: Temporal   There is no height or weight on file to calculate BMI.    GENERAL: Alert and oriented. Appears well and in no acute distress.  HEENT: Atraumatic. Conjunctiva clear. No obvious abnormalities on inspection of external nose and ears.  NECK: Normal movements of the head and neck.  LUNGS: On inspection, no signs of respiratory distress. Breathing rate appears normal. No obvious gross SOB, gasping or wheezing, and no conversational dyspnea.  CV: No obvious cyanosis.  MS: Moves all visible extremities without noticeable abnormality.  PSYCH/NEURO: Pleasant and cooperative. No obvious depression or anxiety. Speech and thought processing grossly intact.  ASSESSMENT AND PLAN:  Problem List Items Addressed This Visit       Other   COVID-19 - Primary    Symptoms started yesterday, positive covid-19 test today. She states the provider she saw at the [facility] prescribed her antivirals for COVID.  Agree with this and recommend that she start them.  Encourage rest, plenty of fluids.  She can take Tylenol as needed for headache and fever  Reviewed home care instructions for COVID. Advised self-isolation at home for at least 5 days. After 5 days, if improved and fever resolved, can be in public, but should wear a mask around others for an additional 5 days. If symptoms, esp, dyspnea develops/worsens, recommend in-person evaluation at either an urgent care or the emergency room.         I discussed the assessment and treatment plan with the patient. The patient was provided an opportunity to ask questions and all were answered. The patient agreed with the plan and demonstrated an understanding  of the instructions.   The patient was advised to call back or seek an in-person evaluation if the symptoms worsen or if the condition fails to improve as anticipated.   Charyl Dancer, NP

## 2022-05-05 NOTE — Assessment & Plan Note (Signed)
Symptoms started yesterday, positive covid-19 test today. She states the provider she saw at the [facility] prescribed her antivirals for COVID.  Agree with this and recommend that she start them.  Encourage rest, plenty of fluids.  She can take Tylenol as needed for headache and fever  Reviewed home care instructions for COVID. Advised self-isolation at home for at least 5 days. After 5 days, if improved and fever resolved, can be in public, but should wear a mask around others for an additional 5 days. If symptoms, esp, dyspnea develops/worsens, recommend in-person evaluation at either an urgent care or the emergency room.

## 2022-05-29 ENCOUNTER — Ambulatory Visit: Payer: BC Managed Care – PPO | Admitting: Neurology

## 2022-07-14 ENCOUNTER — Encounter: Payer: Self-pay | Admitting: Nurse Practitioner

## 2022-07-14 ENCOUNTER — Ambulatory Visit: Payer: BC Managed Care – PPO | Admitting: Nurse Practitioner

## 2022-07-14 VITALS — BP 110/84 | HR 86 | Temp 97.3°F | Wt 189.0 lb

## 2022-07-14 DIAGNOSIS — R591 Generalized enlarged lymph nodes: Secondary | ICD-10-CM | POA: Diagnosis not present

## 2022-07-14 NOTE — Patient Instructions (Signed)
It was great to see you!  Use warm compresses on your neck three times a day for about 10 minutes. You can take ibuprofen or tylenol as needed for pain. Let me know if it doesn't go away within the next 3 weeks.   Let's follow-up if symptoms worsen or don't improve   Take care,  Vance Peper, NP

## 2022-07-14 NOTE — Progress Notes (Signed)
   Acute Office Visit  Subjective:     Patient ID: Margaret Durham, female    DOB: February 22, 1971, 51 y.o.   MRN: [id number]  Chief Complaint  Patient presents with   Acute Visit    Lymph nodes swollen, right side under the jaw. notice a weeks ago. Pt rated pain 6 when touch.No pain meds.    HPI Patient is in today for painful bump under her right chin.  She states that has been there for about a week.  She noticed it when she was watching her face.  She denies fevers, shortness of breath, chest pain, URI symptoms.  She is not taking anything for this.  ROS See pertinent positives and negatives per HPI.     Objective:    BP 110/84 (BP Location: Left Arm, Patient Position: Sitting, Cuff Size: Large)   Pulse 86   Temp (!) 97.3 F (36.3 C) (Oral)   Wt 189 lb (85.7 kg)   SpO2 96%   BMI 30.97 kg/m    Physical Exam Vitals and nursing note reviewed.  Constitutional:      General: She is not in acute distress.    Appearance: Normal appearance.  HENT:     Head: Normocephalic.     Right Ear: Tympanic membrane, ear canal and external ear normal.     Left Ear: Tympanic membrane, ear canal and external ear normal.  Eyes:     Conjunctiva/sclera: Conjunctivae normal.  Neck:     Comments: Submandibular lymphadenopathy x 1 on right side Cardiovascular:     Rate and Rhythm: Normal rate and regular rhythm.     Pulses: Normal pulses.     Heart sounds: Normal heart sounds.  Pulmonary:     Effort: Pulmonary effort is normal.     Breath sounds: Normal breath sounds.  Musculoskeletal:     Cervical back: Normal range of motion. Tenderness present.  Skin:    General: Skin is warm.  Neurological:     General: No focal deficit present.     Mental Status: She is alert and oriented to person, place, and time.  Psychiatric:        Mood and Affect: Mood normal.        Behavior: Behavior normal.        Thought Content: Thought content normal.        Judgment: Judgment normal.        Assessment & Plan:   Problem List Items Addressed This Visit       Immune and Lymphatic   Lymphadenopathy - Primary    1 swollen, tender submandibular lymph node on right side.  No other lymph node swollen in cervical, post auricle, free auricle, clavicular lymph nodes palpated.  No red flags on exam.  We will have her use warm compresses to the area 3 times a day and she can take ibuprofen or Tylenol as needed for pain.  Follow-up if lymph node does not go away in the next 3 weeks.       No orders of the defined types were placed in this encounter.   Return if symptoms worsen or fail to improve.  Charyl Dancer, NP

## 2022-07-14 NOTE — Assessment & Plan Note (Signed)
1 swollen, tender submandibular lymph node on right side.  No other lymph node swollen in cervical, post auricle, free auricle, clavicular lymph nodes palpated.  No red flags on exam.  We will have her use warm compresses to the area 3 times a day and she can take ibuprofen or Tylenol as needed for pain.  Follow-up if lymph node does not go away in the next 3 weeks.

## 2022-09-16 ENCOUNTER — Telehealth: Payer: Self-pay | Admitting: Nurse Practitioner

## 2022-09-16 DIAGNOSIS — R591 Generalized enlarged lymph nodes: Secondary | ICD-10-CM

## 2022-09-16 DIAGNOSIS — E538 Deficiency of other specified B group vitamins: Secondary | ICD-10-CM

## 2022-09-16 NOTE — Telephone Encounter (Signed)
Pt want to know blood work because of her last appointment. Please give her a call pt

## 2022-09-17 ENCOUNTER — Other Ambulatory Visit (INDEPENDENT_AMBULATORY_CARE_PROVIDER_SITE_OTHER): Payer: BC Managed Care – PPO

## 2022-09-17 DIAGNOSIS — R591 Generalized enlarged lymph nodes: Secondary | ICD-10-CM

## 2022-09-17 DIAGNOSIS — E538 Deficiency of other specified B group vitamins: Secondary | ICD-10-CM | POA: Insufficient documentation

## 2022-09-17 NOTE — Addendum Note (Signed)
Addended by: Vance Peper A on: 09/17/2022 09:47 AM   Modules accepted: Orders

## 2022-09-17 NOTE — Telephone Encounter (Signed)
Left VM, adv pt to call back

## 2022-09-18 LAB — COMPREHENSIVE METABOLIC PANEL
AG Ratio: 1.8 (calc) (ref 1.0–2.5)
ALT: 11 U/L (ref 6–29)
AST: 16 U/L (ref 10–35)
Albumin: 4.2 g/dL (ref 3.6–5.1)
Alkaline phosphatase (APISO): 86 U/L (ref 37–153)
BUN: 18 mg/dL (ref 7–25)
CO2: 27 mmol/L (ref 20–32)
Calcium: 9.6 mg/dL (ref 8.6–10.4)
Chloride: 104 mmol/L (ref 98–110)
Creat: 1.01 mg/dL (ref 0.50–1.03)
Globulin: 2.3 g/dL (calc) (ref 1.9–3.7)
Glucose, Bld: 138 mg/dL — ABNORMAL HIGH (ref 65–99)
Potassium: 4.5 mmol/L (ref 3.5–5.3)
Sodium: 138 mmol/L (ref 135–146)
Total Bilirubin: 0.3 mg/dL (ref 0.2–1.2)
Total Protein: 6.5 g/dL (ref 6.1–8.1)

## 2022-09-18 LAB — CBC WITH DIFFERENTIAL/PLATELET
Absolute Monocytes: 405 cells/uL (ref 200–950)
Basophils Absolute: 50 cells/uL (ref 0–200)
Basophils Relative: 1 %
Eosinophils Absolute: 70 cells/uL (ref 15–500)
Eosinophils Relative: 1.4 %
HCT: 37.1 % (ref 35.0–45.0)
Hemoglobin: 12.5 g/dL (ref 11.7–15.5)
Lymphs Abs: 2020 cells/uL (ref 850–3900)
MCH: 30.8 pg (ref 27.0–33.0)
MCHC: 33.7 g/dL (ref 32.0–36.0)
MCV: 91.4 fL (ref 80.0–100.0)
MPV: 10.1 fL (ref 7.5–12.5)
Monocytes Relative: 8.1 %
Neutro Abs: 2455 cells/uL (ref 1500–7800)
Neutrophils Relative %: 49.1 %
Platelets: 329 10*3/uL (ref 140–400)
RBC: 4.06 10*6/uL (ref 3.80–5.10)
RDW: 12.5 % (ref 11.0–15.0)
Total Lymphocyte: 40.4 %
WBC: 5 10*3/uL (ref 3.8–10.8)

## 2022-09-18 LAB — VITAMIN B12: Vitamin B-12: 287 pg/mL (ref 200–1100)

## 2023-01-31 ENCOUNTER — Other Ambulatory Visit: Payer: Self-pay | Admitting: Nurse Practitioner

## 2023-06-04 ENCOUNTER — Other Ambulatory Visit: Payer: Self-pay | Admitting: Nurse Practitioner

## 2023-06-04 NOTE — Telephone Encounter (Signed)
Requesting: SUMATRIPTAN SUCC 50 MG TABLET  Last Visit: 07/14/2022 Next Visit: Visit date not found Last Refill: 02/02/2023  Please Advise

## 2023-08-14 ENCOUNTER — Other Ambulatory Visit: Payer: Self-pay | Admitting: Nurse Practitioner

## 2023-08-14 NOTE — Telephone Encounter (Signed)
I spoke with pt, she understands she needs to see you for refills. She did not want to schedule a cpe or an ov.

## 2023-12-01 ENCOUNTER — Ambulatory Visit: Payer: 59 | Admitting: Obstetrics

## 2023-12-01 ENCOUNTER — Encounter: Payer: Self-pay | Admitting: Obstetrics

## 2023-12-01 VITALS — BP 105/70 | HR 68 | Ht 64.17 in | Wt 201.0 lb

## 2023-12-01 DIAGNOSIS — N3281 Overactive bladder: Secondary | ICD-10-CM

## 2023-12-01 DIAGNOSIS — R351 Nocturia: Secondary | ICD-10-CM | POA: Diagnosis not present

## 2023-12-01 DIAGNOSIS — K580 Irritable bowel syndrome with diarrhea: Secondary | ICD-10-CM | POA: Diagnosis not present

## 2023-12-01 LAB — POCT URINALYSIS DIPSTICK
Bilirubin, UA: NEGATIVE
Blood, UA: NEGATIVE
Glucose, UA: NEGATIVE
Ketones, UA: NEGATIVE
Leukocytes, UA: NEGATIVE
Nitrite, UA: NEGATIVE
Protein, UA: NEGATIVE
Spec Grav, UA: 1.02 (ref 1.010–1.025)
Urobilinogen, UA: 0.2 U/dL
pH, UA: 8 — AB (ref 5.0–8.0)

## 2023-12-01 MED ORDER — GEMTESA 75 MG PO TABS
75.0000 mg | ORAL_TABLET | Freq: Every day | ORAL | Status: AC
Start: 2023-12-01 — End: ?

## 2023-12-01 MED ORDER — GEMTESA 75 MG PO TABS
75.0000 mg | ORAL_TABLET | Freq: Every day | ORAL | 2 refills | Status: DC
Start: 2023-12-01 — End: 2024-03-04

## 2023-12-01 MED ORDER — ESTRADIOL 0.1 MG/GM VA CREA
0.5000 g | TOPICAL_CREAM | VAGINAL | 3 refills | Status: AC
Start: 2023-12-03 — End: ?
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
## 2023-12-01 NOTE — Patient Instructions (Addendum)
We discussed the symptoms of overactive bladder (OAB), which include urinary urgency, urinary frequency, night-time urination, with or without urge incontinence.  We discussed management including behavioral therapy (decreasing bladder irritants by following a bladder diet, urge suppression strategies, timed voids, bladder retraining), physical therapy, medication; and for refractory cases posterior tibial nerve stimulation, sacral neuromodulation, and intravesical botulinum toxin injection.   For Beta-3 agonist medication, we discussed the potential side effect of elevated blood pressure which is more likely to occur in individuals with uncontrolled hypertension. You were given samples for Gemtesa 75 mg.  It can take a month to start working so give it time, but if you have bothersome side effects call sooner and we can try a different medication.  Call us if you have trouble filling the prescription or if it's not covered by your insurance.  For night time frequency: - avoid fluid intake after 6pm - elevated your feet during the day or use compression socks to reduce lower extremity swelling  For vaginal atrophy (thinning of the vaginal tissue that can cause dryness and burning) and UTI prevention we discussed estrogen replacement in the form of vaginal cream.   Start vaginal estrogen therapy nightly for two weeks then 2 times weekly at night. This can be placed with your finger or an applicator inside the vagina and around the urethra.  Please let us know if the prescription is too expensive and we can look for alternative options.   Is vaginal estrogen therapy safe for me? Vaginal estrogen preparations act on the vaginal skin, and only a very tiny amount is absorbed into the bloodstream (0.01%).  They work in a similar way to hand or face cream.  There is minimal absorption and they are therefore perfectly safe. If you have had breast cancer and have persistent troublesome symptoms which aren't  settling with vaginal moisturisers and lubricants, local estrogen treatment may be a possibility, but consultation with your oncologist should take place first.

## 2023-12-01 NOTE — Assessment & Plan Note (Signed)
-   reports improved symptoms with avoidance of dietary triggers due to lactose intolerance - encouraged trial of IBGuard

## 2023-12-01 NOTE — Assessment & Plan Note (Addendum)
-   onset around the start of vasomotor symptoms - avoid fluid intake after 6pm - elevated your feet during the day or use compression socks to reduce lower extremity swelling - reports snoring, consider workup for sleep apnea

## 2023-12-01 NOTE — Assessment & Plan Note (Addendum)
-   POCT UA negative, bladder scan 23mL - mirabegron  25mg  (unable to recall), tolerodine 1mg  without relief   - We discussed the symptoms of overactive bladder (OAB), which include urinary urgency, urinary frequency, nocturia, with or without urge incontinence.  While we do not know the exact etiology of OAB, several treatment options exist. We discussed management including behavioral therapy (decreasing bladder irritants, urge suppression strategies, timed voids, bladder retraining), physical therapy, medication; for refractory cases posterior tibial nerve stimulation, sacral neuromodulation, and intravesical botulinum toxin injection.  For anticholinergic medications, we discussed the potential side effects of anticholinergics including dry eyes, dry mouth, constipation, cognitive impairment and urinary retention. For Beta-3 agonist medication, we discussed the potential side effect of elevated blood pressure which is more likely to occur in individuals with uncontrolled hypertension. - encouraged caffeine reduction, fluid management and reviewed instructions for Kegel exercises - samples and Rx for trial of Gemtesa , if cost prohibitive can repeat mirabegron  at 50mg  therapeutic dose - trial of vaginal estrogen if no relief after medications and behavioral modifications due to onset of symptoms at the time of vasomotor symptoms.

## 2023-12-06 ENCOUNTER — Encounter: Payer: Self-pay | Admitting: Obstetrics

## 2024-02-03 ENCOUNTER — Other Ambulatory Visit: Payer: Self-pay | Admitting: Obstetrics and Gynecology

## 2024-02-03 DIAGNOSIS — Z1231 Encounter for screening mammogram for malignant neoplasm of breast: Secondary | ICD-10-CM

## 2024-02-05 NOTE — Progress Notes (Signed)
 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 Key: [id number] -  PA Case ID: [id number]  Rx #: [id number]   Received instant approval: Case [id number]  Status: Approved  Prior Auth: Coverage Start Date:02/05/2024- Coverage End Date:02/04/2026

## 2024-02-17 ENCOUNTER — Other Ambulatory Visit (HOSPITAL_COMMUNITY): Payer: Self-pay | Admitting: Family Medicine

## 2024-02-17 DIAGNOSIS — I059 Rheumatic mitral valve disease, unspecified: Secondary | ICD-10-CM

## 2024-02-19 ENCOUNTER — Ambulatory Visit: Attending: Cardiology

## 2024-02-19 DIAGNOSIS — I081 Rheumatic disorders of both mitral and tricuspid valves: Secondary | ICD-10-CM

## 2024-02-19 DIAGNOSIS — I059 Rheumatic mitral valve disease, unspecified: Secondary | ICD-10-CM | POA: Diagnosis not present

## 2024-02-19 LAB — ECHOCARDIOGRAM COMPLETE
AR max vel: 1.79 cm2
AV Area VTI: 1.71 cm2
AV Area mean vel: 1.68 cm2
AV Mean grad: 3 mmHg
AV Peak grad: 5.1 mmHg
Ao pk vel: 1.13 m/s
Area-P 1/2: 2.69 cm2
S' Lateral: 2.2 cm

## 2024-02-25 ENCOUNTER — Ambulatory Visit
Admission: RE | Admit: 2024-02-25 | Discharge: 2024-02-25 | Disposition: A | Discharge status: Home / Self Care | Source: Ambulatory Visit | Attending: Obstetrics and Gynecology | Admitting: Obstetrics and Gynecology

## 2024-02-25 DIAGNOSIS — Z1231 Encounter for screening mammogram for malignant neoplasm of breast: Secondary | ICD-10-CM

## 2024-02-29 ENCOUNTER — Ambulatory Visit: Payer: 59 | Admitting: Obstetrics

## 2024-03-04 ENCOUNTER — Encounter: Payer: Self-pay | Admitting: Obstetrics

## 2024-03-04 ENCOUNTER — Ambulatory Visit: Admitting: Obstetrics

## 2024-03-04 VITALS — BP 105/75 | HR 103

## 2024-03-04 DIAGNOSIS — R351 Nocturia: Secondary | ICD-10-CM

## 2024-03-04 DIAGNOSIS — N3281 Overactive bladder: Secondary | ICD-10-CM

## 2024-03-04 NOTE — Assessment & Plan Note (Signed)
-   12/01/23 POCT UA negative, bladder scan 23mL - reduced frequency with caffeine reduction - mirabegron  25mg  (unable to recall), tolerodine 1mg  without relief   - We previously discussed the symptoms of overactive bladder (OAB), which include urinary urgency, urinary frequency, nocturia, with or without urge incontinence.  While we do not know the exact etiology of OAB, several treatment options exist. We discussed management including behavioral therapy (decreasing bladder irritants, urge suppression strategies, timed voids, bladder retraining), physical therapy, medication; for refractory cases posterior tibial nerve stimulation, sacral neuromodulation, and intravesical botulinum toxin injection.  For anticholinergic medications, we discussed the potential side effects of anticholinergics including dry eyes, dry mouth, constipation, cognitive impairment and urinary retention. For Beta-3 agonist medication, we discussed the potential side effect of elevated blood pressure which is more likely to occur in individuals with uncontrolled hypertension. - encouraged to continue caffeine reduction, fluid management and Kegel exercises - encouraged to start samples and Rx for trial of Gemtesa , if cost prohibitive can repeat mirabegron  at 50mg  therapeutic dose - start trial of vaginal estrogen if no relief after medications and behavioral modifications due to onset of symptoms at the time of vasomotor symptoms.

## 2024-03-04 NOTE — Patient Instructions (Addendum)
 Good job cutting down on caffeine and decreasing fluid intake 3 hours before bedtime.   For vaginal atrophy (thinning of the vaginal tissue that can cause dryness and burning) and UTI prevention we discussed estrogen replacement in the form of vaginal cream.   Start vaginal estrogen therapy nightly for two weeks then 2 times weekly at night. This can be placed with your finger or an applicator inside the vagina and around the urethra.  Please let us  know if the prescription is too expensive and we can look for alternative options.   We discussed the symptoms of overactive bladder (OAB), which include urinary urgency, urinary frequency, night-time urination, with or without urge incontinence.  We discussed management including behavioral therapy (decreasing bladder irritants by following a bladder diet, urge suppression strategies, timed voids, bladder retraining), physical therapy, medication; and for refractory cases posterior tibial nerve stimulation, sacral neuromodulation, and intravesical botulinum toxin injection.   For Beta-3 agonist medication, we discussed the potential side effect of elevated blood pressure which is more likely to occur in individuals with uncontrolled hypertension. You were given samples for Gemtesa  75 mg.  It can take a month to start working so give it time, but if you have bothersome side effects call sooner and we can try a different medication.  Call us  if you have trouble filling the prescription or if it's not covered by your insurance.  Women should try to eat at least 21 to 25 grams of fiber a day, while men should aim for 30 to 38 grams a day. You can add fiber to your diet with food or a fiber supplement such as psyllium (metamucil), benefiber, or fibercon.   Here's a look at how much dietary fiber is found in some common foods. When buying packaged foods, check the Nutrition Facts label for fiber content. It can vary among brands.  Fruits Serving size Total  fiber (grams)*  Raspberries 1 cup 8.0  Pear 1 medium 5.5  Apple, with skin 1 medium 4.5  Banana 1 medium 3.0  Orange 1 medium 3.0  Strawberries 1 cup 3.0   Vegetables Serving size Total fiber (grams)*  Green peas, boiled 1 cup 9.0  Broccoli, boiled 1 cup chopped 5.0  Turnip greens, boiled 1 cup 5.0  Brussels sprouts, boiled 1 cup 4.0  Potato, with skin, baked 1 medium 4.0  Sweet corn, boiled 1 cup 3.5  Cauliflower, raw 1 cup chopped 2.0  Carrot, raw 1 medium 1.5   Grains Serving size Total fiber (grams)*  Spaghetti, whole-wheat, cooked 1 cup 6.0  Barley, pearled, cooked 1 cup 6.0  Bran flakes 3/4 cup 5.5  Quinoa, cooked 1 cup 5.0  Oat bran muffin 1 medium 5.0  Oatmeal, instant, cooked 1 cup 5.0  Popcorn, air-popped 3 cups 3.5  Brown rice, cooked 1 cup 3.5  Bread, whole-wheat 1 slice 2.0  Bread, rye 1 slice 2.0   Legumes, nuts and seeds Serving size Total fiber (grams)*  Split peas, boiled 1 cup 16.0  Lentils, boiled 1 cup 15.5  Black beans, boiled 1 cup 15.0  Baked beans, canned 1 cup 10.0  Chia seeds 1 ounce 10.0  Almonds 1 ounce (23 nuts) 3.5  Pistachios 1 ounce (49 nuts) 3.0  Sunflower kernels 1 ounce 3.0  *Rounded to nearest 0.5 gram. Source: [employer] for [employer], [employer]	[place]

## 2024-03-04 NOTE — Assessment & Plan Note (Signed)
-   improved with fluid management - onset around the start of vasomotor symptoms, encouraged to start vaginal estrogen with instructions reviewed. - avoid fluid intake after 6pm - elevated feet during the day or use compression socks to reduce lower extremity swelling - reports snoring, consider workup for sleep apnea

## 2024-03-04 NOTE — Progress Notes (Signed)
 [facility] Return Visit  SUBJECTIVE  History of Present Illness: Margaret Durham is a 53 y.o. female seen in follow-up for overactive bladder, nocturia, IBS-D. Plan at last visit was trial of Gemtesa , vaginal estrogen, trial of IBguard.   Busy due to son's pheochromocytoma s/p surgery, has not started gemtesa  Tried cutting down coffee to intermittent use and stopping water intake.  Intermittently sleeps all night with headaches Voids 8x/day down from 12/day and 0-2x/night down from 2-3x/night Colonoscopy last week, repeat in 10 years  Past Medical History: Patient  has a past medical history of Bacterial vaginosis, Bell's palsy, Fibroids (10/27/2004), GERD (gastroesophageal reflux disease), Hemangioma (10/27/2010), History of D&C (10/27/1984), bladder infections, migraine headaches, IBS (irritable bowel syndrome), MVP (mitral valve prolapse), and Otitis (10/27/2010).   Past Surgical History: She  has a past surgical history that includes Endometrial biopsy (10/27/2010); Cesarean section (10/27/2008); Tooth extraction; Bunionectomy; and Colonoscopy.   Medications: She has a current medication list which includes the following prescription(s): estradiol , magnesium, omega-3 fatty acids, estroven menopause relief, sumatriptan , cyanocobalamin, zinc gluconate, cholecalciferol, omeprazole -sodium bicarbonate, and gemtesa .   Allergies: Patient is allergic to macrobid [nitrofurantoin macrocrystal].   Social History: Patient  reports that she has never smoked. She has never used smokeless tobacco. She reports that she does not currently use alcohol. She reports that she does not use drugs.     OBJECTIVE     Physical Exam: Vitals:   03/04/24 0907  BP: 105/75  Pulse: (!) 103   Gen: No apparent distress, A&O x 3.  Detailed Urogynecologic Evaluation:  Deferred.        ASSESSMENT AND PLAN    Margaret Durham is a 53 y.o. with:  1. Nocturia   2. OAB (overactive bladder)      Nocturia Assessment & Plan: - improved with fluid management - onset around the start of vasomotor symptoms, encouraged to start vaginal estrogen with instructions reviewed. - avoid fluid intake after 6pm - elevated feet during the day or use compression socks to reduce lower extremity swelling - reports snoring, consider workup for sleep apnea    OAB (overactive bladder) Assessment & Plan: - 12/01/23 POCT UA negative, bladder scan 23mL - reduced frequency with caffeine reduction - mirabegron  25mg  (unable to recall), tolerodine 1mg  without relief   - We previously discussed the symptoms of overactive bladder (OAB), which include urinary urgency, urinary frequency, nocturia, with or without urge incontinence.  While we do not know the exact etiology of OAB, several treatment options exist. We discussed management including behavioral therapy (decreasing bladder irritants, urge suppression strategies, timed voids, bladder retraining), physical therapy, medication; for refractory cases posterior tibial nerve stimulation, sacral neuromodulation, and intravesical botulinum toxin injection.  For anticholinergic medications, we discussed the potential side effects of anticholinergics including dry eyes, dry mouth, constipation, cognitive impairment and urinary retention. For Beta-3 agonist medication, we discussed the potential side effect of elevated blood pressure which is more likely to occur in individuals with uncontrolled hypertension. - encouraged to continue caffeine reduction, fluid management and Kegel exercises - encouraged to start samples and Rx for trial of Gemtesa , if cost prohibitive can repeat mirabegron  at 50mg  therapeutic dose - start trial of vaginal estrogen if no relief after medications and behavioral modifications due to onset of symptoms at the time of vasomotor symptoms.       Darlene Ehlers, MD

## 2024-06-03 ENCOUNTER — Ambulatory Visit: Admitting: Obstetrics

## 2024-06-21 ENCOUNTER — Encounter: Payer: Self-pay | Admitting: Obstetrics

## 2024-06-21 ENCOUNTER — Ambulatory Visit: Admitting: Obstetrics

## 2024-06-21 VITALS — BP 109/69 | HR 86

## 2024-06-21 DIAGNOSIS — R351 Nocturia: Secondary | ICD-10-CM | POA: Diagnosis not present

## 2024-06-21 DIAGNOSIS — N3281 Overactive bladder: Secondary | ICD-10-CM | POA: Diagnosis not present

## 2024-06-21 NOTE — Progress Notes (Signed)
 [facility] Return Visit  SUBJECTIVE  History of Present Illness: Margaret Durham is a 53 y.o. female seen in follow-up for overactive bladder, nocturia, IBS-D. Plan at last visit was trial of Gemtesa , vaginal estrogen, trial of IBguard.   Started exercises 02/2024 and cut out sugar and coffee from diet Reports getting up 1x/night with fluid management. Baseline 2-3x/night Intermittent Gemtesa  use for 2 weeks, last use 2 weeks ago.  Tried cutting down coffee to intermittent use and stopping water intake.  Intermittently sleeps all night with headaches Voids 8x/day down from 12/day Colonoscopy 11/2023, repeat in 10 years Denies vaginal estrogen use and improvement of indigestion Reports son is doing well after pheochromocytoma surgery  Past Medical History: Patient  has a past medical history of Bacterial vaginosis, Bell's palsy, Fibroids (10/27/2004), GERD (gastroesophageal reflux disease), Hemangioma (10/27/2010), History of D&C (10/27/1984), bladder infections, migraine headaches, IBS (irritable bowel syndrome), MVP (mitral valve prolapse), and Otitis (10/27/2010).   Past Surgical History: She  has a past surgical history that includes Endometrial biopsy (10/27/2010); Cesarean section (10/27/2008); Tooth extraction; Bunionectomy; and Colonoscopy.   Medications: She has a current medication list which includes the following prescription(s): cholecalciferol, estradiol , magnesium, omega-3 fatty acids, estroven menopause relief, sumatriptan , gemtesa , cyanocobalamin, and zinc gluconate.   Allergies: Patient is allergic to macrobid [nitrofurantoin macrocrystal].   Social History: Patient  reports that she has never smoked. She has never used smokeless tobacco. She reports that she does not currently use alcohol. She reports that she does not use drugs.     OBJECTIVE     Physical Exam: Vitals:   06/21/24 1406  BP: 109/69  Pulse: 86   Gen: No apparent distress, A&O  x 3.  Detailed Urogynecologic Evaluation:  Deferred.        ASSESSMENT AND PLAN    Margaret Durham is a 53 y.o. with:  1. Nocturia   2. OAB (overactive bladder)      Nocturia Assessment & Plan: - improved with fluid management and caffeine reduction - onset around the start of vasomotor symptoms, did not start vaginal estrogen with instructions reviewed. - continue to avoid fluid intake after 6pm - elevated feet during the day or use compression socks to reduce lower extremity swelling - reports snoring, consider workup for sleep apnea - return for evaluation if clinical change    OAB (overactive bladder) Assessment & Plan: - 12/01/23 POCT UA negative, bladder scan 23mL - continue to report reduced frequency with caffeine reduction and fluid management - mirabegron  25mg  (unable to recall), tolerodine 1mg  without relief   - gemtesa  last used 2 weeks ago without improvement of symptoms - We previously discussed the symptoms of overactive bladder (OAB), which include urinary urgency, urinary frequency, nocturia, with or without urge incontinence.  While we do not know the exact etiology of OAB, several treatment options exist. We discussed management including behavioral therapy (decreasing bladder irritants, urge suppression strategies, timed voids, bladder retraining), physical therapy, medication; for refractory cases posterior tibial nerve stimulation, sacral neuromodulation, and intravesical botulinum toxin injection.  For anticholinergic medications, we discussed the potential side effects of anticholinergics including dry eyes, dry mouth, constipation, cognitive impairment and urinary retention. For Beta-3 agonist medication, we discussed the potential side effect of elevated blood pressure which is more likely to occur in individuals with uncontrolled hypertension. - encouraged to continue caffeine reduction, fluid management and Kegel exercises - did not start trial of vaginal  estrogen - discontinue gemtesa  due to minimal clinical change, RTC for  evaluation if clinical change    Margaret ONEIDA Gillis, MD

## 2024-06-21 NOTE — Assessment & Plan Note (Signed)
-   improved with fluid management and caffeine reduction - onset around the start of vasomotor symptoms, did not start vaginal estrogen with instructions reviewed. - continue to avoid fluid intake after 6pm - elevated feet during the day or use compression socks to reduce lower extremity swelling - reports snoring, consider workup for sleep apnea - return for evaluation if clinical change

## 2024-06-21 NOTE — Assessment & Plan Note (Signed)
-   12/01/23 POCT UA negative, bladder scan 23mL - continue to report reduced frequency with caffeine reduction and fluid management - mirabegron  25mg  (unable to recall), tolerodine 1mg  without relief   - gemtesa  last used 2 weeks ago without improvement of symptoms - We previously discussed the symptoms of overactive bladder (OAB), which include urinary urgency, urinary frequency, nocturia, with or without urge incontinence.  While we do not know the exact etiology of OAB, several treatment options exist. We discussed management including behavioral therapy (decreasing bladder irritants, urge suppression strategies, timed voids, bladder retraining), physical therapy, medication; for refractory cases posterior tibial nerve stimulation, sacral neuromodulation, and intravesical botulinum toxin injection.  For anticholinergic medications, we discussed the potential side effects of anticholinergics including dry eyes, dry mouth, constipation, cognitive impairment and urinary retention. For Beta-3 agonist medication, we discussed the potential side effect of elevated blood pressure which is more likely to occur in individuals with uncontrolled hypertension. - encouraged to continue caffeine reduction, fluid management and Kegel exercises - did not start trial of vaginal estrogen - discontinue gemtesa  due to minimal clinical change, RTC for evaluation if clinical change

## 2024-06-21 NOTE — Patient Instructions (Signed)
 Continue fluid management and caffeine reduction.   Please return if you experience any change in urinary or vaginal symptoms.

## 2024-09-12 ENCOUNTER — Ambulatory Visit: Admitting: Physical Medicine and Rehabilitation

## 2024-09-12 ENCOUNTER — Other Ambulatory Visit (INDEPENDENT_AMBULATORY_CARE_PROVIDER_SITE_OTHER): Payer: Self-pay

## 2024-09-12 ENCOUNTER — Encounter: Payer: Self-pay | Admitting: Physical Medicine and Rehabilitation

## 2024-09-12 DIAGNOSIS — M7918 Myalgia, other site: Secondary | ICD-10-CM

## 2024-09-12 DIAGNOSIS — M542 Cervicalgia: Secondary | ICD-10-CM

## 2024-09-12 DIAGNOSIS — G8929 Other chronic pain: Secondary | ICD-10-CM

## 2024-09-12 DIAGNOSIS — M898X1 Other specified disorders of bone, shoulder: Secondary | ICD-10-CM

## 2024-09-12 NOTE — Progress Notes (Unsigned)
 Margaret Durham - 53 y.o. female MRN [id number]  Date of birth: [birth year]  Office Visit Note: Visit Date: 09/12/2024 PCP: Verena Mems, MD Referred by: Verena Mems, MD  Subjective: Chief Complaint  Patient presents with   Neck - Pain   HPI: Margaret Durham is a 53 y.o. female who comes in today as a self referral for evaluation of chronic, worsening and severe left sided neck pain radiating down into left scapular region. Pain ongoing for over 1 year. Her pain worsens with activity. She reports severe pain with side to side rotation of her neck, especially when looking at computer monitors while working. She describes pain as sore sensation, currently rates as 5 out of 10. Some relief of pain with home exercise regimen, rest and use of medications. She completed short course of chiropractic/massage therapy several weeks ago with short lived pain relief. No prior imaging of cervical spine. Patient denies focal weakness, numbness and tingling. No recent trauma or falls.        Review of Systems  Musculoskeletal:  Positive for myalgias and neck pain.  Neurological:  Negative for tingling, sensory change, focal weakness and weakness.  All other systems reviewed and are negative.  Otherwise per HPI.  Assessment & Plan: Visit Diagnoses:    ICD-10-CM   1. Cervicalgia  M54.2 XR Cervical Spine 2 or 3 views    2. Chronic scapular pain  M89.8X1    G89.29     3. Myofascial pain syndrome  M79.18        Plan: Findings:  Chronic, worsening and severe left sided neck pain radiating to left scapular region. Patient continues to have severe pain despite good conservative therapies such as chiropractic/massage therapy, home exercise regimen, rest and use of medications. I obtained radiographs in the office today that show straightening of cervical spine and facet arthropathy at C3-C4. I do think her pain is most consistent with facet mediated discomfort, she has pain with side to  side rotation of her neck. We discussed treatment plan in detail today. Next step is to obtain cervical MRI imaging. Depending on results of cervical MRI imaging we discussed possibility of performing facet joint injections. We discussed medication management, she can try topical Voltaren gel at home. We will see her back for cervical MRI review. Her exam today is non focal, good strength noted to bilateral upper extremities.     Meds & Orders: No orders of the defined types were placed in this encounter.   Orders Placed This Encounter  Procedures   XR Cervical Spine 2 or 3 views    Follow-up: Return for Cervical MRI review.   Procedures: No procedures performed      Clinical History: No specialty comments available.   She reports that she has never smoked. She has never used smokeless tobacco. No results for input(s): HGBA1C, LABURIC in the last 8760 hours.  Objective:  VS:  HT:    WT:   BMI:     BP:   HR: bpm  TEMP: ( )  RESP:  Physical Exam Vitals and nursing note reviewed.  HENT:     Head: Normocephalic and atraumatic.     Right Ear: External ear normal.     Left Ear: External ear normal.     Nose: Nose normal.     Mouth/Throat:     Mouth: Mucous membranes are moist.  Eyes:     Extraocular Movements: Extraocular movements intact.  Cardiovascular:     Rate  and Rhythm: Normal rate.     Pulses: Normal pulses.  Pulmonary:     Effort: Pulmonary effort is normal.  Abdominal:     General: Abdomen is flat. There is no distension.  Musculoskeletal:        General: Tenderness present.     Cervical back: Tenderness present.     Comments: No discomfort noted with flexion, extension and side-to-side rotation. Patient has good strength in the upper extremities including 5 out of 5 strength in wrist extension, long finger flexion and APB. Shoulder range of motion is full bilaterally without any sign of impingement. There is no atrophy of the hands intrinsically. Sensation  intact bilaterally. Myofascial tenderness noted to left levator scapulae and trapezius region upon palpation. Negative Hoffman's sign. Negative Spurling's sign.     Skin:    General: Skin is warm and dry.     Capillary Refill: Capillary refill takes less than 2 seconds.  Neurological:     General: No focal deficit present.     Mental Status: She is alert and oriented to person, place, and time.  Psychiatric:        Mood and Affect: Mood normal.        Behavior: Behavior normal.     Ortho Exam  Imaging: XR Cervical Spine 2 or 3 views Result Date: 09/12/2024 AP and lateral radiographs show straightening of cervical spine. There is facet arthropathy, worse on the left at C3-C4. No spondylolisthesis.    Past Medical/Family/Surgical/Social History: Medications & Allergies reviewed per EMR, new medications updated. Patient Active Problem List   Diagnosis Date Noted   Vitamin B 12 deficiency 09/17/2022   Lymphadenopathy 07/14/2022   COVID-19 05/05/2022   Vitamin D  deficiency 03/25/2022   Hyperlipidemia 03/25/2022   Fatigue 07/26/2019   Hemangioma of liver 03/11/2018   Irritable bowel syndrome 03/11/2018   Migraine with aura and without status migrainosus, not intractable 03/11/2018   Mitral valve prolapse 03/11/2018   Nocturia 08/25/2017   OAB (overactive bladder) 08/25/2017   Well woman exam 04/18/2015   Fibroids 05/14/2012   Multiple hemangiomas 05/14/2012   Past Medical History:  Diagnosis Date   Bacterial vaginosis    recurrent    Bell's palsy    Fibroids 10/27/2004   GERD (gastroesophageal reflux disease)    Hemangioma 10/27/2010   found on CT scan (liver)   History of D&C 10/27/1984   Hx of bladder infections    Hx of migraine headaches    IBS (irritable bowel syndrome)    MVP (mitral valve prolapse)    Otitis 10/27/2010   Family History  Problem Relation Age of Onset   Breast cancer Maternal Aunt    Heart disease Maternal Aunt    Cancer Maternal Aunt         breast   Heart disease Maternal Grandmother    Hypertension Maternal Grandmother    Diabetes Maternal Grandmother    Stroke Maternal Grandmother    Stroke Maternal Grandfather    Cancer Maternal Grandfather        lung    Bladder Cancer Neg Hx    Uterine cancer Neg Hx    Past Surgical History:  Procedure Laterality Date   BUNIONECTOMY     CESAREAN SECTION  10/27/2008   COLONOSCOPY     ENDOMETRIAL BIOPSY  10/27/2010   TOOTH EXTRACTION     Social History   Occupational History   Not on file  Tobacco Use   Smoking status: Never   Smokeless tobacco:  Never  Vaping Use   Vaping status: Never Used  Substance and Sexual Activity   Alcohol use: Not Currently   Drug use: No   Sexual activity: Yes    Partners: Male    Birth control/protection: None

## 2024-09-12 NOTE — Progress Notes (Unsigned)
 Pain Scale   Average Pain 4 Patient advising she has chronic neck pain radiating to left side when turning her head while at work, and turning looking at computer.        +Driver, -BT, -Dye Allergies.

## 2024-09-14 ENCOUNTER — Encounter: Payer: Self-pay | Admitting: Physical Medicine and Rehabilitation

## 2024-09-20 ENCOUNTER — Ambulatory Visit
Admission: RE | Admit: 2024-09-20 | Discharge: 2024-09-20 | Disposition: A | Discharge status: Home / Self Care | Source: Ambulatory Visit | Attending: Physical Medicine and Rehabilitation | Admitting: Physical Medicine and Rehabilitation

## 2024-10-24 ENCOUNTER — Ambulatory Visit: Admitting: Physical Medicine and Rehabilitation

## 2024-10-24 DIAGNOSIS — M47812 Spondylosis without myelopathy or radiculopathy, cervical region: Secondary | ICD-10-CM | POA: Diagnosis not present

## 2024-10-24 DIAGNOSIS — M542 Cervicalgia: Secondary | ICD-10-CM | POA: Diagnosis not present

## 2024-10-24 NOTE — Progress Notes (Unsigned)
 Pain Scale   Average Pain 3 Patient advising she has chronic neck pain that stays in the base of neck. Patient is here for MRI review        +Driver, -BT, -Dye Allergies.

## 2024-10-24 NOTE — Progress Notes (Unsigned)
 "  Margaret Durham - 53 y.o. female MRN [id number]  Date of birth: 03-17-1971  Office Visit Note: Visit Date: 10/24/2024 PCP: Verena Mems, MD Referred by: Verena Mems, MD  Subjective: Chief Complaint  Patient presents with   Neck - Pain   HPI: Margaret Durham is a 53 y.o. female who comes in today for evaluation of chronic, worsening and severe left sided neck pain radiating to scapular region. She is here today for cervical MRI review. Pain ongoing for over a year, worsens with side to side movement of her neck. She describes pain as sore and aching sensation, currently rates as 5 out of 10. Some relief of pain with chiropractic/massage therapy, home exercise regimen, rest and use of medications. Unfortunately, we do not have read back on cervical MRI imaging from [facility], however I did review imaging. No central canal stenosis. No significant nerve impingement. There is facet arthropathy at the level of L3-L4, worse on the left. Patient denies focal weakness, numbness and tingling. No recent trauma or falls.      Review of Systems  Musculoskeletal:  Positive for neck pain.  Neurological:  Negative for tingling, sensory change, focal weakness and weakness.  All other systems reviewed and are negative.  Otherwise per HPI.  Assessment & Plan: Visit Diagnoses:    ICD-10-CM   1. Cervicalgia  M54.2     2. Facet arthropathy, cervical  M47.812        Plan: Findings:  Chronic, worsening and severe left sided neck pain radiating to scapular region. Patient continues to have severe pain despite good conservative therapies such as chiropractic/massage therapy, home exercise regimen, rest and use of medications. I discussed recent cervical MRI with patient today using imaging and spine model. There is no significant nerve impingement, no high grade spinal canal stenosis. There is facet arthropathy at the level of L3-L4, worse on the left. I do think this arthritis could be  a pain generator for her. We discussed treatment plan in detail today. Next step is to perform diagnostic and hopefully therapeutic left L3-L4 facet joint injection. Patient would like some time to think about injection. She will call our office if she chooses to proceed with injection. Her exam today is non focal, good strength noted to bilateral upper extremities.     Meds & Orders: No orders of the defined types were placed in this encounter.  No orders of the defined types were placed in this encounter.   Follow-up: Return if symptoms worsen or fail to improve.   Procedures: No procedures performed      Clinical History: No specialty comments available.   She reports that she has never smoked. She has never used smokeless tobacco. No results for input(s): HGBA1C, LABURIC in the last 8760 hours.  Objective:  VS:  HT:    WT:   BMI:     BP:   HR: bpm  TEMP: ( )  RESP:  Physical Exam Vitals and nursing note reviewed.  HENT:     Head: Normocephalic and atraumatic.     Right Ear: External ear normal.     Left Ear: External ear normal.     Nose: Nose normal.     Mouth/Throat:     Mouth: Mucous membranes are moist.  Eyes:     Extraocular Movements: Extraocular movements intact.  Cardiovascular:     Rate and Rhythm: Normal rate.     Pulses: Normal pulses.  Pulmonary:     Effort:  Pulmonary effort is normal.  Abdominal:     General: Abdomen is flat. There is no distension.  Musculoskeletal:        General: Tenderness present.     Cervical back: Tenderness present.     Comments: Pain with side-to-side rotation, especially when turning to left side. Patient has good strength in the upper extremities including 5 out of 5 strength in wrist extension, long finger flexion and APB. Shoulder range of motion is full bilaterally without any sign of impingement. There is no atrophy of the hands intrinsically. Sensation intact bilaterally. Negative Hoffman's sign. Negative Spurling's  sign.     Skin:    General: Skin is warm and dry.     Capillary Refill: Capillary refill takes less than 2 seconds.  Neurological:     General: No focal deficit present.     Mental Status: She is alert and oriented to person, place, and time.  Psychiatric:        Mood and Affect: Mood normal.        Behavior: Behavior normal.     Ortho Exam  Imaging: No results found.  Past Medical/Family/Surgical/Social History: Medications & Allergies reviewed per EMR, new medications updated. Patient Active Problem List   Diagnosis Date Noted   Vitamin B 12 deficiency 09/17/2022   Lymphadenopathy 07/14/2022   COVID-19 05/05/2022   Vitamin D  deficiency 03/25/2022   Hyperlipidemia 03/25/2022   Fatigue 07/26/2019   Hemangioma of liver 03/11/2018   Irritable bowel syndrome 03/11/2018   Migraine with aura and without status migrainosus, not intractable 03/11/2018   Mitral valve prolapse 03/11/2018   Nocturia 08/25/2017   OAB (overactive bladder) 08/25/2017   Well woman exam 04/18/2015   Fibroids 05/14/2012   Multiple hemangiomas 05/14/2012   Past Medical History:  Diagnosis Date   Bacterial vaginosis    recurrent    Bell's palsy    Fibroids 10/27/2004   GERD (gastroesophageal reflux disease)    Hemangioma 10/27/2010   found on CT scan (liver)   History of D&C 10/27/1984   Hx of bladder infections    Hx of migraine headaches    IBS (irritable bowel syndrome)    MVP (mitral valve prolapse)    Otitis 10/27/2010   Family History  Problem Relation Age of Onset   Breast cancer Maternal Aunt    Heart disease Maternal Aunt    Cancer Maternal Aunt        breast   Heart disease Maternal Grandmother    Hypertension Maternal Grandmother    Diabetes Maternal Grandmother    Stroke Maternal Grandmother    Stroke Maternal Grandfather    Cancer Maternal Grandfather        lung    Bladder Cancer Neg Hx    Uterine cancer Neg Hx    Past Surgical History:  Procedure Laterality Date    BUNIONECTOMY     CESAREAN SECTION  10/27/2008   COLONOSCOPY     ENDOMETRIAL BIOPSY  10/27/2010   TOOTH EXTRACTION     Social History   Occupational History   Not on file  Tobacco Use   Smoking status: Never   Smokeless tobacco: Never  Vaping Use   Vaping status: Never Used  Substance and Sexual Activity   Alcohol use: Not Currently   Drug use: No   Sexual activity: Yes    Partners: Male    Birth control/protection: None    "

## 2024-10-25 ENCOUNTER — Encounter: Payer: Self-pay | Admitting: Physical Medicine and Rehabilitation
